# Patient Record
Sex: Female | Born: 1975 | Race: White | Hispanic: No | Marital: Single | Smoking: Former smoker
Health system: Southern US, Community
[De-identification: ages and names within clinical notes are randomized; demographics above are authoritative.]

## PROBLEM LIST (undated history)

## (undated) DIAGNOSIS — I10 Essential (primary) hypertension: Secondary | ICD-10-CM

## (undated) DIAGNOSIS — K469 Unspecified abdominal hernia without obstruction or gangrene: Secondary | ICD-10-CM

## (undated) DIAGNOSIS — Z8719 Personal history of other diseases of the digestive system: Secondary | ICD-10-CM

## (undated) DIAGNOSIS — D649 Anemia, unspecified: Secondary | ICD-10-CM

## (undated) DIAGNOSIS — T8859XA Other complications of anesthesia, initial encounter: Secondary | ICD-10-CM

## (undated) DIAGNOSIS — K819 Cholecystitis, unspecified: Secondary | ICD-10-CM

## (undated) HISTORY — DX: Anemia, unspecified: D64.9

## (undated) HISTORY — DX: Essential (primary) hypertension: I10

---

## 1998-11-28 ENCOUNTER — Emergency Department (HOSPITAL_COMMUNITY): Admission: EM | Admit: 1998-11-28 | Discharge: 1998-11-28 | Payer: Self-pay | Admitting: Emergency Medicine

## 2000-01-03 HISTORY — PX: BREAST SURGERY: SHX581

## 2009-05-26 ENCOUNTER — Emergency Department: Admit: 2009-05-26 | Payer: Self-pay | Source: Emergency Department

## 2011-06-05 ENCOUNTER — Ambulatory Visit (HOSPITAL_COMMUNITY)
Admission: EM | Admit: 2011-06-05 | Discharge: 2011-06-07 | Disposition: A | Discharge status: Home / Self Care | Payer: Medicaid Other | Source: Ambulatory Visit | Attending: General Surgery | Admitting: General Surgery

## 2011-06-05 ENCOUNTER — Encounter (HOSPITAL_COMMUNITY): Payer: Self-pay | Admitting: *Deleted

## 2011-06-05 ENCOUNTER — Emergency Department (HOSPITAL_COMMUNITY): Payer: Medicaid Other

## 2011-06-05 DIAGNOSIS — K819 Cholecystitis, unspecified: Secondary | ICD-10-CM

## 2011-06-05 DIAGNOSIS — K8010 Calculus of gallbladder with chronic cholecystitis without obstruction: Secondary | ICD-10-CM

## 2011-06-05 DIAGNOSIS — K8020 Calculus of gallbladder without cholecystitis without obstruction: Secondary | ICD-10-CM

## 2011-06-05 HISTORY — DX: Personal history of other diseases of the digestive system: Z87.19

## 2011-06-05 HISTORY — DX: Unspecified abdominal hernia without obstruction or gangrene: K46.9

## 2011-06-05 HISTORY — DX: Cholecystitis, unspecified: K81.9

## 2011-06-05 LAB — CBC
HCT: 36.2 % (ref 36.0–46.0)
Hemoglobin: 12 g/dL (ref 12.0–15.0)
MCH: 28.8 pg (ref 26.0–34.0)
MCHC: 33.1 g/dL (ref 30.0–36.0)
MCV: 87 fL (ref 78.0–100.0)
Platelets: 382 10*3/uL (ref 150–400)
RBC: 4.16 MIL/uL (ref 3.87–5.11)
RDW: 13.4 % (ref 11.5–15.5)
WBC: 8.9 10*3/uL (ref 4.0–10.5)

## 2011-06-05 LAB — DIFFERENTIAL
Basophils Absolute: 0 10*3/uL (ref 0.0–0.1)
Basophils Relative: 0 % (ref 0–1)
Eosinophils Absolute: 0 10*3/uL (ref 0.0–0.7)
Eosinophils Relative: 0 % (ref 0–5)
Lymphocytes Relative: 19 % (ref 12–46)
Lymphs Abs: 1.6 10*3/uL (ref 0.7–4.0)
Monocytes Absolute: 0.2 10*3/uL (ref 0.1–1.0)
Monocytes Relative: 3 % (ref 3–12)
Neutro Abs: 7 10*3/uL (ref 1.7–7.7)
Neutrophils Relative %: 79 % — ABNORMAL HIGH (ref 43–77)

## 2011-06-05 LAB — POCT PREGNANCY, URINE: Preg Test, Ur: NEGATIVE

## 2011-06-05 LAB — COMPREHENSIVE METABOLIC PANEL
ALT: 13 U/L (ref 0–35)
AST: 20 U/L (ref 0–37)
Albumin: 4.4 g/dL (ref 3.5–5.2)
Alkaline Phosphatase: 103 U/L (ref 39–117)
BUN: 8 mg/dL (ref 6–23)
CO2: 25 mEq/L (ref 19–32)
Calcium: 9.6 mg/dL (ref 8.4–10.5)
Chloride: 99 mEq/L (ref 96–112)
Creatinine, Ser: 0.83 mg/dL (ref 0.50–1.10)
GFR calc Af Amer: >90 mL/min (ref >90)
GFR calc non Af Amer: >90 mL/min (ref >90)
Glucose, Bld: 129 mg/dL — ABNORMAL HIGH (ref 70–99)
Potassium: 3.2 mEq/L — ABNORMAL LOW (ref 3.5–5.1)
Sodium: 136 mEq/L (ref 135–145)
Total Bilirubin: 0.5 mg/dL (ref 0.3–1.2)
Total Protein: 8.5 g/dL — ABNORMAL HIGH (ref 6.0–8.3)

## 2011-06-05 LAB — URINALYSIS, ROUTINE W REFLEX MICROSCOPIC
Glucose, UA: NEGATIVE mg/dL
Hgb urine dipstick: NEGATIVE
Specific Gravity, Urine: 1.025 (ref 1.005–1.030)
Urobilinogen, UA: 0.2 mg/dL (ref 0.0–1.0)
pH: 7.5 (ref 5.0–8.0)

## 2011-06-05 LAB — LIPASE, BLOOD: Lipase: 29 U/L (ref 11–59)

## 2011-06-05 MED ORDER — ONDANSETRON HCL 4 MG/2ML IJ SOLN
INTRAMUSCULAR | Status: AC
  Filled 2011-06-05: qty 2

## 2011-06-05 MED ORDER — HYDROMORPHONE HCL PF 1 MG/ML IJ SOLN
INTRAMUSCULAR | Status: AC
  Filled 2011-06-05: qty 1

## 2011-06-05 MED ORDER — PIPERACILLIN-TAZOBACTAM 3.375 G IVPB
3.3750 g | Freq: Three times a day (TID) | INTRAVENOUS | Status: DC
  Administered 2011-06-05 – 2011-06-06 (×3): 3.375 g via INTRAVENOUS
  Filled 2011-06-05 (×6): qty 50

## 2011-06-05 MED ORDER — MORPHINE SULFATE 2 MG/ML IJ SOLN
1.0000 mg | INTRAMUSCULAR | Status: DC | PRN
  Administered 2011-06-05 (×3): 2 mg via INTRAVENOUS
  Filled 2011-06-05 (×3): qty 1

## 2011-06-05 MED ORDER — PANTOPRAZOLE SODIUM 40 MG IV SOLR
40.0000 mg | Freq: Every day | INTRAVENOUS | Status: DC
  Administered 2011-06-05: 40 mg via INTRAVENOUS
  Filled 2011-06-05 (×2): qty 40

## 2011-06-05 MED ORDER — ONDANSETRON HCL 4 MG/2ML IJ SOLN
INTRAMUSCULAR | Status: AC
  Administered 2011-06-05: 4 mg via INTRAVENOUS
  Filled 2011-06-05: qty 2

## 2011-06-05 MED ORDER — HYDROMORPHONE HCL PF 1 MG/ML IJ SOLN
1.0000 mg | Freq: Once | INTRAMUSCULAR | Status: AC
  Administered 2011-06-05: 1 mg via INTRAVENOUS
  Filled 2011-06-05: qty 1

## 2011-06-05 MED ORDER — HYDROMORPHONE HCL PF 1 MG/ML IJ SOLN
1.0000 mg | Freq: Once | INTRAMUSCULAR | Status: AC
  Administered 2011-06-05: 1 mg via INTRAVENOUS

## 2011-06-05 MED ORDER — ONDANSETRON HCL 4 MG/2ML IJ SOLN
4.0000 mg | Freq: Once | INTRAMUSCULAR | Status: AC
  Administered 2011-06-05: 4 mg via INTRAVENOUS

## 2011-06-05 MED ORDER — POTASSIUM CHLORIDE IN NACL 20-0.9 MEQ/L-% IV SOLN
INTRAVENOUS | Status: DC
  Administered 2011-06-05 – 2011-06-06 (×2): via INTRAVENOUS
  Administered 2011-06-06: 1000 mL via INTRAVENOUS
  Filled 2011-06-05 (×8): qty 1000

## 2011-06-05 MED ORDER — CHLORHEXIDINE GLUCONATE 4 % EX LIQD
1.0000 "application " | Freq: Once | CUTANEOUS | Status: AC
  Administered 2011-06-06: 1 via TOPICAL
  Filled 2011-06-05: qty 15

## 2011-06-05 MED ORDER — HYDROMORPHONE HCL PF 1 MG/ML IJ SOLN
1.0000 mg | INTRAMUSCULAR | Status: DC | PRN
  Administered 2011-06-05 – 2011-06-06 (×4): 1 mg via INTRAVENOUS
  Filled 2011-06-05 (×4): qty 1

## 2011-06-05 MED ORDER — ONDANSETRON HCL 4 MG/2ML IJ SOLN
4.0000 mg | Freq: Four times a day (QID) | INTRAMUSCULAR | Status: DC | PRN
  Administered 2011-06-05 – 2011-06-06 (×3): 4 mg via INTRAVENOUS
  Filled 2011-06-05 (×2): qty 2

## 2011-06-05 NOTE — ED Provider Notes (Signed)
Medical screening examination/treatment/procedure(s) were conducted as a shared visit with non-physician practitioner(s) and myself.  I personally evaluated the patient during the encounter  2 months of intermittent right upper quadrant epigastric pain usually lasting only a few hours now lasting about 12 hours today the patient is much improved in the ED with only minimal tenderness to the epigastrium right upper quadrant now, for nausea is now controlled, her labs are unremarkable, her ultrasound shows stones in the gallbladder without thickening or pericholecystic fluid. [facility] is paged for likely close followup.  Seen by [facility] who will admit.  Hurman Horn, MD 06/05/11 [phone]

## 2011-06-05 NOTE — ED Provider Notes (Signed)
History     CSN: [id number]  Arrival date & time 06/05/11  0919   First MD Initiated Contact with Patient 06/05/11 1029      Chief Complaint  Patient presents with  . Abdominal Pain    (Consider location/radiation/quality/duration/timing/severity/associated sxs/prior treatment) HPI Patient presents emergency department right upper quadrant abdominal pain that began around 2 AM.  Patient, says she has had this kind of pain over the last several months and did not seem to last as much as it has in the last few weeks.  Patient, says she has had nausea and vomiting associated with it.  Patient denies chest pain, shortness of breath, vaginal discharge, vaginal bleeding, dysuria, fever, or weakness.  Patient states that nothing seems to make the pain better and she thinks eating makes the pain worse.  Patient has no previous abdominal surgeries Past Medical History  Diagnosis Date  . Hernia     Past Surgical History  Procedure Date  . Breast surgery     No family history on file.  History  Substance Use Topics  . Smoking status: Never Smoker   . Smokeless tobacco: Not on file  . Alcohol Use: Yes     occ    OB History    Grav Para Term Preterm Abortions TAB SAB Ect Mult Living                  Review of Systems All other systems negative except as documented in the HPI. All pertinent positives and negatives as reviewed in the HPI.  Allergies  Review of patient's allergies indicates no known allergies.  Home Medications  No current outpatient prescriptions on file.  BP 132/78  Pulse 68  Temp(Src) 97.9 F (36.6 C) (Oral)  Resp 18  SpO2 98%  LMP 06/02/2011  Physical Exam  Constitutional: She appears well-developed and well-nourished. She appears distressed.  HENT:  Head: Normocephalic and atraumatic.  Mouth/Throat: Oropharynx is clear and moist.  Eyes: Pupils are equal, round, and reactive to light.  Cardiovascular: Normal rate, regular rhythm and normal heart  sounds.  Exam reveals no gallop and no friction rub.   No murmur heard. Pulmonary/Chest: Effort normal and breath sounds normal. No respiratory distress. She has no wheezes.  Abdominal: Soft. Normal appearance and bowel sounds are normal. She exhibits no mass. There is tenderness in the right upper quadrant and epigastric area. There is guarding. There is no rigidity, no rebound, no tenderness at McBurney's point and negative Murphy's sign.    Skin: Skin is warm and dry. No rash noted.    ED Course  Procedures (including critical care time)  Labs Reviewed  URINALYSIS, ROUTINE W REFLEX MICROSCOPIC - Abnormal; Notable for the following:    APPearance CLOUDY (*)    All other components within normal limits  DIFFERENTIAL - Abnormal; Notable for the following:    Neutrophils Relative 79 (*)    All other components within normal limits  COMPREHENSIVE METABOLIC PANEL - Abnormal; Notable for the following:    Potassium 3.2 (*)    Glucose, Bld 129 (*)    Total Protein 8.5 (*)    All other components within normal limits  POCT PREGNANCY, URINE  CBC  LIPASE, BLOOD   US Abdomen Complete  06/05/2011  *RADIOLOGY REPORT*  Clinical Data:  Upper abdominal pain.  COMPLETE ABDOMINAL ULTRASOUND  Comparison:  None.  Findings:  Gallbladder:  Two shadowing echogenic stones are seen in the gallbladder, measuring up to 1.7 cm.  The  largest stone appears lodged in the gallbladder neck.  Gallbladder wall measures 2 mm, within normal limits.  No sonographic Murphy's sign.  No pericholecystic fluid.  Common bile duct:  6 mm, slightly prominent for age.  Liver:  No focal lesion identified.  Within normal limits in parenchymal echogenicity.  IVC:  Appears normal.  Pancreas:  No focal abnormality seen.  Spleen:  6.4 cm, negative.  Right Kidney:  12.6 cm.  Normal parenchymal echogenicity.  No hydronephrosis.  Left Kidney:  11.9 cm.  Normal parenchymal echogenicity.  No hydronephrosis.  Abdominal aorta:  No aneurysm  identified.  IMPRESSION:  1.  Cholelithiasis without acute cholecystitis. The largest stone appears lodged in the gallbladder neck. 2.  Common bile duct is mildly prominent for age.  Original Report Authenticated By: Reyes Ivan, M.D.    Surgery has come down and evaluate the patient and will admit her to the hospital for removal of her gallbladder.  Patient is currently comfortable and understands the plan.  Patient has been reassessed x3 while here in the emergency department.  Patient's been kept fully aware of her findings and course here in the ER.     MDM  MDM Reviewed: vitals and nursing note Interpretation: labs and ultrasound Consults: general surgery            Carlyle Dolly, PA-C 06/05/11 1514

## 2011-06-05 NOTE — H&P (Addendum)
I have personally interviewed and examined this patient, and I have reviewed her lab and x-ray findings. I agree with the history and physical as outlined.  She is having recurrent attacks of biliary colic, and had a severe attack last night. She may have low-grade acute cholecystitis, or perhaps just severe biliary colic. She would like to proceed with cholecystectomy this admission.  She will be kept on antibiotics and kept n.p.o. With the hope that we can proceed with cholecystectomy tomorrow.  I discussed the indications, details, techniques, and numerous risks of cholecystectomy with the patient and her mother. All questions are answered. She understands these issues. Her questions are answered. She agrees with this plan.   Angelia Mould. Derrell Lolling, M.D., [facility] General and Minimally invasive Surgery Breast and Colorectal Surgery Office:   [phone] Pager:   [phone]

## 2011-06-05 NOTE — ED Notes (Signed)
Pt vomited small amount of greenish-yellow emesis.

## 2011-06-05 NOTE — H&P (Signed)
Sheena Wells 1975-06-08  [id number].   Primary Care MD: Dr. Waynard Edwards Lacy Duverney) Requesting MD: Dr. Fonnie Jarvis  Chief Complaint: Abdominal Pain, vomiting HPI: Patient is a 36 year old female who presents to [facility] with abdominal pain and vomiting.  She reports her pain began at 2am and has not improved.  She describes her pain as severe and grabbing in nature.  It is located in the epigastric region with radiation to the back and right flank.  This is accompanied by vomiting.  She has had some chills in the last 24 hours but denies fever.  She has had one episode of diarrhea on June 1st.  She also denies urinary symptoms, chest pain, shortness of breath, edema, constipation.  Review of Systems: All systems reviewed and are Negative except as indicated in HPI  No family history on file.  Past Medical History  Diagnosis Date  . Hernia     Past Surgical History  Procedure Date  . Breast surgery (bilateral lumpectomy(benign findings)     Social History:  reports that she has never smoked. She does not have any smokeless tobacco history on file. She reports that she drinks alcohol. She reports that she does not use illicit drugs.  Allergies: No Known Allergies   (Not in a hospital admission)  Blood pressure 132/78, pulse 68, temperature 97.9 F (36.6 C), temperature source Oral, resp. rate 18, last menstrual period 06/02/2011, SpO2 98.00%. Physical Exam:General:  WDWN in NAD.  Pleasant and cooperative.  HEENT:  NCAT, EOMI, no icterus.  NECK:  Supple, no obvious mass or thyroid enlargement.  CV:  RRR, no murmur, no JVD.  CHEST:  No scars.  RESPIRATORY:  Breath sounds equal and clear. Respirations nonlabored.  ABDOMEN:  Soft, mildly tender in the epigastric region, nondistended, no masses, no organomegaly, active bowel sounds, no scars, no hernias.  GU:  No bulges.  MUSCULOSKELETAL:  FROM, good muscle tone, no edema, no venous stasis changes  LYMPHATIC: No palpable cervical,  supraclavicular, axillary adenopathy.  SKIN:  No jaundice or suspicious rashes.  NEUROLOGIC:  Alert and oriented, answers questions appropriately, moves all four extremities equally   Results for orders placed during the hospital encounter of 06/05/11 (from the past 48 hour(s))  URINALYSIS, ROUTINE W REFLEX MICROSCOPIC     Status: Abnormal   Collection Time   06/05/11 10:08 AM      Component Value Range Comment   Color, Urine YELLOW  YELLOW     APPearance CLOUDY (*) CLEAR     Specific Gravity, Urine 1.025  1.005 - 1.030     pH 7.5  5.0 - 8.0     Glucose, UA NEGATIVE  NEGATIVE (mg/dL)    Hgb urine dipstick NEGATIVE  NEGATIVE     Bilirubin Urine NEGATIVE  NEGATIVE     Ketones, ur NEGATIVE  NEGATIVE (mg/dL)    Protein, ur NEGATIVE  NEGATIVE (mg/dL)    Urobilinogen, UA 0.2  0.0 - 1.0 (mg/dL)    Nitrite NEGATIVE  NEGATIVE     Leukocytes, UA NEGATIVE  NEGATIVE  MICROSCOPIC NOT DONE ON URINES WITH NEGATIVE PROTEIN, BLOOD, LEUKOCYTES, NITRITE, OR GLUCOSE <1000 mg/dL.  POCT PREGNANCY, URINE     Status: Normal   Collection Time   06/05/11 10:13 AM      Component Value Range Comment   Preg Test, Ur NEGATIVE  NEGATIVE    CBC     Status: Normal   Collection Time   06/05/11 11:04 AM      Component  Value Range Comment   WBC 8.9  4.0 - 10.5 (K/uL)    RBC 4.16  3.87 - 5.11 (MIL/uL)    Hemoglobin 12.0  12.0 - 15.0 (g/dL)    HCT 16.1  09.6 - 04.5 (%)    MCV 87.0  78.0 - 100.0 (fL)    MCH 28.8  26.0 - 34.0 (pg)    MCHC 33.1  30.0 - 36.0 (g/dL)    RDW 40.9  81.1 - 91.4 (%)    Platelets 382  150 - 400 (K/uL)   DIFFERENTIAL     Status: Abnormal   Collection Time   06/05/11 11:04 AM      Component Value Range Comment   Neutrophils Relative 79 (*) 43 - 77 (%)    Neutro Abs 7.0  1.7 - 7.7 (K/uL)    Lymphocytes Relative 19  12 - 46 (%)    Lymphs Abs 1.6  0.7 - 4.0 (K/uL)    Monocytes Relative 3  3 - 12 (%)    Monocytes Absolute 0.2  0.1 - 1.0 (K/uL)    Eosinophils Relative 0  0 - 5 (%)     Eosinophils Absolute 0.0  0.0 - 0.7 (K/uL)    Basophils Relative 0  0 - 1 (%)    Basophils Absolute 0.0  0.0 - 0.1 (K/uL)   COMPREHENSIVE METABOLIC PANEL     Status: Abnormal   Collection Time   06/05/11 11:04 AM      Component Value Range Comment   Sodium 136  135 - 145 (mEq/L)    Potassium 3.2 (*) 3.5 - 5.1 (mEq/L)    Chloride 99  96 - 112 (mEq/L)    CO2 25  19 - 32 (mEq/L)    Glucose, Bld 129 (*) 70 - 99 (mg/dL)    BUN 8  6 - 23 (mg/dL)    Creatinine, Ser 7.82  0.50 - 1.10 (mg/dL)    Calcium 9.6  8.4 - 10.5 (mg/dL)    Total Protein 8.5 (*) 6.0 - 8.3 (g/dL)    Albumin 4.4  3.5 - 5.2 (g/dL)    AST 20  0 - 37 (U/L)    ALT 13  0 - 35 (U/L)    Alkaline Phosphatase 103  39 - 117 (U/L)    Total Bilirubin 0.5  0.3 - 1.2 (mg/dL)    GFR calc non Af Amer >90  >90 (mL/min)    GFR calc Af Amer >90  >90 (mL/min)   LIPASE, BLOOD     Status: Normal   Collection Time   06/05/11 11:04 AM      Component Value Range Comment   Lipase 29  11 - 59 (U/L)    US Abdomen Complete  06/05/2011  *RADIOLOGY REPORT*  Clinical Data:  Upper abdominal pain.  COMPLETE ABDOMINAL ULTRASOUND  Comparison:  None.  Findings:  Gallbladder:  Two shadowing echogenic stones are seen in the gallbladder, measuring up to 1.7 cm.  The largest stone appears lodged in the gallbladder neck.  Gallbladder wall measures 2 mm, within normal limits.  No sonographic Murphy's sign.  No pericholecystic fluid.  Common bile duct:  6 mm, slightly prominent for age.  Liver:  No focal lesion identified.  Within normal limits in parenchymal echogenicity.  IVC:  Appears normal.  Pancreas:  No focal abnormality seen.  Spleen:  6.4 cm, negative.  Right Kidney:  12.6 cm.  Normal parenchymal echogenicity.  No hydronephrosis.  Left Kidney:  11.9 cm.  Normal parenchymal  echogenicity.  No hydronephrosis.  Abdominal aorta:  No aneurysm identified.  IMPRESSION:  1.  Cholelithiasis without acute cholecystitis. The largest stone appears lodged in the gallbladder  neck. 2.  Common bile duct is mildly prominent for age.  Original Report Authenticated By: Reyes Ivan, M.D.       Assessment/Plan 1.  Gallstone in gallbladder neck with unresolving pain and vomiting:  Will admit patient to med/surg floor.  Will make NPO and start IVFs.  Start IV abx, prn zofran and prn pain meds.  Dr. Derrell Lolling will see patient later today and likely plan for lap cholecystectomy tomorrow.    Rayanna Matusik 06/05/2011, 2:26 PM

## 2011-06-05 NOTE — ED Notes (Signed)
Pt is here with left side abdominal pain that radiates around to back and has umbilical hernia.  Pt reports vomiting.  Pt sts started at 0200.  Pt denies vaginal discharge or bleeding or urinary s/s.  LMP-  Ended 06/02/2011

## 2011-06-05 NOTE — Progress Notes (Signed)
UR complete 

## 2011-06-06 ENCOUNTER — Encounter (HOSPITAL_COMMUNITY): Payer: Self-pay | Admitting: Certified Registered"

## 2011-06-06 ENCOUNTER — Encounter (HOSPITAL_COMMUNITY): Admission: EM | Disposition: A | Discharge status: Home / Self Care | Payer: Self-pay | Source: Ambulatory Visit

## 2011-06-06 ENCOUNTER — Inpatient Hospital Stay (HOSPITAL_COMMUNITY): Payer: Medicaid Other | Admitting: Certified Registered"

## 2011-06-06 ENCOUNTER — Inpatient Hospital Stay (HOSPITAL_COMMUNITY): Payer: Medicaid Other

## 2011-06-06 HISTORY — PX: CHOLECYSTECTOMY: SHX55

## 2011-06-06 LAB — COMPREHENSIVE METABOLIC PANEL
ALT: 10 U/L (ref 0–35)
AST: 13 U/L (ref 0–37)
Albumin: 3.5 g/dL (ref 3.5–5.2)
Alkaline Phosphatase: 83 U/L (ref 39–117)
BUN: 6 mg/dL (ref 6–23)
CO2: 27 mEq/L (ref 19–32)
Calcium: 8.5 mg/dL (ref 8.4–10.5)
Chloride: 103 mEq/L (ref 96–112)
Creatinine, Ser: 0.93 mg/dL (ref 0.50–1.10)
GFR calc Af Amer: >90 mL/min (ref >90)
GFR calc non Af Amer: 79 mL/min — ABNORMAL LOW (ref >90)
Glucose, Bld: 122 mg/dL — ABNORMAL HIGH (ref 70–99)
Potassium: 3.3 mEq/L — ABNORMAL LOW (ref 3.5–5.1)
Sodium: 139 mEq/L (ref 135–145)
Total Bilirubin: 0.8 mg/dL (ref 0.3–1.2)
Total Protein: 7 g/dL (ref 6.0–8.3)

## 2011-06-06 LAB — CBC
HCT: 32.2 % — ABNORMAL LOW (ref 36.0–46.0)
Hemoglobin: 10.5 g/dL — ABNORMAL LOW (ref 12.0–15.0)
MCH: 28.5 pg (ref 26.0–34.0)
MCHC: 32.6 g/dL (ref 30.0–36.0)
MCV: 87.3 fL (ref 78.0–100.0)
Platelets: 347 10*3/uL (ref 150–400)
RBC: 3.69 MIL/uL — ABNORMAL LOW (ref 3.87–5.11)
RDW: 13.5 % (ref 11.5–15.5)
WBC: 9.9 10*3/uL (ref 4.0–10.5)

## 2011-06-06 SURGERY — LAPAROSCOPIC CHOLECYSTECTOMY WITH INTRAOPERATIVE CHOLANGIOGRAM
Anesthesia: General | Site: Abdomen | Wound class: Contaminated

## 2011-06-06 MED ORDER — BUPIVACAINE-EPINEPHRINE 0.25% -1:200000 IJ SOLN
INTRAMUSCULAR | Status: DC | PRN
  Administered 2011-06-06: 16 mL

## 2011-06-06 MED ORDER — HYDROMORPHONE HCL PF 1 MG/ML IJ SOLN
0.2500 mg | INTRAMUSCULAR | Status: DC | PRN
  Administered 2011-06-06 (×2): 0.5 mg via INTRAVENOUS

## 2011-06-06 MED ORDER — LACTATED RINGERS IV SOLN
INTRAVENOUS | Status: DC | PRN
  Administered 2011-06-06 (×2): via INTRAVENOUS

## 2011-06-06 MED ORDER — GLYCOPYRROLATE 0.2 MG/ML IJ SOLN
INTRAMUSCULAR | Status: DC | PRN
  Administered 2011-06-06: 0.6 mg via INTRAVENOUS

## 2011-06-06 MED ORDER — SODIUM CHLORIDE 0.9 % IV SOLN
INTRAVENOUS | Status: DC | PRN
  Administered 2011-06-06: 13:00:00

## 2011-06-06 MED ORDER — MORPHINE SULFATE 2 MG/ML IJ SOLN: 2.0000 mg | INTRAMUSCULAR | Status: DC | PRN

## 2011-06-06 MED ORDER — ONDANSETRON HCL 4 MG/2ML IJ SOLN: 4.0000 mg | Freq: Four times a day (QID) | INTRAMUSCULAR | Status: DC | PRN

## 2011-06-06 MED ORDER — CEFAZOLIN SODIUM-DEXTROSE 2-3 GM-% IV SOLR
2.0000 g | Freq: Three times a day (TID) | INTRAVENOUS | Status: DC
  Administered 2011-06-06 – 2011-06-07 (×2): 2 g via INTRAVENOUS
  Filled 2011-06-06 (×3): qty 50

## 2011-06-06 MED ORDER — FENTANYL CITRATE 0.05 MG/ML IJ SOLN
INTRAMUSCULAR | Status: DC | PRN
  Administered 2011-06-06: 100 ug via INTRAVENOUS
  Administered 2011-06-06 (×3): 50 ug via INTRAVENOUS

## 2011-06-06 MED ORDER — LIDOCAINE HCL (CARDIAC) 20 MG/ML IV SOLN
INTRAVENOUS | Status: DC | PRN
  Administered 2011-06-06: 100 mg via INTRAVENOUS

## 2011-06-06 MED ORDER — NEOSTIGMINE METHYLSULFATE 1 MG/ML IJ SOLN
INTRAMUSCULAR | Status: DC | PRN
  Administered 2011-06-06: 4 mg via INTRAVENOUS

## 2011-06-06 MED ORDER — PROPOFOL 10 MG/ML IV EMUL
INTRAVENOUS | Status: DC | PRN
  Administered 2011-06-06: 120 mg via INTRAVENOUS

## 2011-06-06 MED ORDER — CEFAZOLIN SODIUM-DEXTROSE 2-3 GM-% IV SOLR
2.0000 g | Freq: Three times a day (TID) | INTRAVENOUS | Status: DC
  Filled 2011-06-06 (×2): qty 50

## 2011-06-06 MED ORDER — ONDANSETRON HCL 4 MG/2ML IJ SOLN
INTRAMUSCULAR | Status: DC | PRN
  Administered 2011-06-06: 4 mg via INTRAVENOUS

## 2011-06-06 MED ORDER — 0.9 % SODIUM CHLORIDE (POUR BTL) OPTIME
TOPICAL | Status: DC | PRN
  Administered 2011-06-06: 1000 mL

## 2011-06-06 MED ORDER — POTASSIUM CHLORIDE IN NACL 20-0.9 MEQ/L-% IV SOLN
INTRAVENOUS | Status: DC
  Administered 2011-06-06: 21:00:00 via INTRAVENOUS
  Filled 2011-06-06 (×4): qty 1000

## 2011-06-06 MED ORDER — ONDANSETRON HCL 4 MG/2ML IJ SOLN: 4.0000 mg | Freq: Once | INTRAMUSCULAR | Status: DC | PRN

## 2011-06-06 MED ORDER — HYDROCODONE-ACETAMINOPHEN 5-325 MG PO TABS
1.0000 | ORAL_TABLET | ORAL | Status: DC | PRN
  Administered 2011-06-06 – 2011-06-07 (×3): 2 via ORAL
  Filled 2011-06-06 (×3): qty 2

## 2011-06-06 MED ORDER — MIDAZOLAM HCL 5 MG/5ML IJ SOLN
INTRAMUSCULAR | Status: DC | PRN
  Administered 2011-06-06 (×2): 2 mg via INTRAVENOUS

## 2011-06-06 MED ORDER — HEMOSTATIC AGENTS (NO CHARGE) OPTIME
TOPICAL | Status: DC | PRN
  Administered 2011-06-06: 1

## 2011-06-06 MED ORDER — ROCURONIUM BROMIDE 100 MG/10ML IV SOLN
INTRAVENOUS | Status: DC | PRN
  Administered 2011-06-06: 50 mg via INTRAVENOUS

## 2011-06-06 MED ORDER — CEFAZOLIN SODIUM-DEXTROSE 2-3 GM-% IV SOLR
2.0000 g | INTRAVENOUS | Status: AC
  Administered 2011-06-06: 2 g via INTRAVENOUS
  Filled 2011-06-06: qty 50

## 2011-06-06 MED ORDER — SODIUM CHLORIDE 0.9 % IR SOLN
Status: DC | PRN
  Administered 2011-06-06 (×2): 1000 mL

## 2011-06-06 MED ORDER — LACTATED RINGERS IV SOLN
INTRAVENOUS | Status: DC
  Administered 2011-06-06: 12:00:00 via INTRAVENOUS

## 2011-06-06 MED ORDER — ONDANSETRON HCL 4 MG PO TABS: 4.0000 mg | ORAL_TABLET | Freq: Four times a day (QID) | ORAL | Status: DC | PRN

## 2011-06-06 MED ORDER — CEFAZOLIN SODIUM 1-5 GM-% IV SOLN
INTRAVENOUS | Status: AC
  Filled 2011-06-06: qty 50

## 2011-06-06 SURGICAL SUPPLY — 52 items
ADH SKN CLS APL DERMABOND .7 (GAUZE/BANDAGES/DRESSINGS) ×1
ADH SKN CLS LQ APL DERMABOND (GAUZE/BANDAGES/DRESSINGS) ×1
APPLIER CLIP ROT 10 11.4 M/L (STAPLE) ×2
APR CLP MED LRG 11.4X10 (STAPLE) ×1
BAG SPEC RTRVL LRG 6X4 10 (ENDOMECHANICALS) ×1
BLADE SURG ROTATE 9660 (MISCELLANEOUS) IMPLANT
CANISTER SUCTION 2500CC (MISCELLANEOUS) ×2 IMPLANT
CHLORAPREP W/TINT 26ML (MISCELLANEOUS) ×2 IMPLANT
CLIP APPLIE ROT 10 11.4 M/L (STAPLE) ×1 IMPLANT
CLOTH BEACON ORANGE TIMEOUT ST (SAFETY) ×2 IMPLANT
COVER MAYO STAND STRL (DRAPES) ×2 IMPLANT
COVER SURGICAL LIGHT HANDLE (MISCELLANEOUS) ×2 IMPLANT
DECANTER SPIKE VIAL GLASS SM (MISCELLANEOUS) ×2 IMPLANT
DERMABOND ADHESIVE PROPEN (GAUZE/BANDAGES/DRESSINGS) ×1
DERMABOND ADVANCED (GAUZE/BANDAGES/DRESSINGS) ×1
DERMABOND ADVANCED .7 DNX12 (GAUZE/BANDAGES/DRESSINGS) ×1 IMPLANT
DERMABOND ADVANCED .7 DNX6 (GAUZE/BANDAGES/DRESSINGS) IMPLANT
DRAPE C-ARM 42X72 X-RAY (DRAPES) ×2 IMPLANT
DRAPE UTILITY 15X26 W/TAPE STR (DRAPE) ×2 IMPLANT
ELECT REM PT RETURN 9FT ADLT (ELECTROSURGICAL) ×2
ELECTRODE REM PT RTRN 9FT ADLT (ELECTROSURGICAL) ×1 IMPLANT
GLOVE BIO SURGEON STRL SZ 6 (GLOVE) ×1 IMPLANT
GLOVE BIO SURGEON STRL SZ7.5 (GLOVE) ×1 IMPLANT
GLOVE BIO SURGEON STRL SZ8 (GLOVE) ×1 IMPLANT
GLOVE BIOGEL PI IND STRL 7.0 (GLOVE) IMPLANT
GLOVE BIOGEL PI IND STRL 7.5 (GLOVE) IMPLANT
GLOVE BIOGEL PI IND STRL 8 (GLOVE) IMPLANT
GLOVE BIOGEL PI INDICATOR 7.0 (GLOVE) ×2
GLOVE BIOGEL PI INDICATOR 7.5 (GLOVE) ×1
GLOVE BIOGEL PI INDICATOR 8 (GLOVE) ×1
GLOVE EUDERMIC 7 POWDERFREE (GLOVE) ×2 IMPLANT
GLOVE SURG SS PI 7.0 STRL IVOR (GLOVE) ×2 IMPLANT
GOWN PREVENTION PLUS XLARGE (GOWN DISPOSABLE) ×3 IMPLANT
GOWN STRL NON-REIN LRG LVL3 (GOWN DISPOSABLE) ×6 IMPLANT
HEMOSTAT SURGICEL 2X4 FIBR (HEMOSTASIS) ×1 IMPLANT
KIT BASIN OR (CUSTOM PROCEDURE TRAY) ×2 IMPLANT
KIT ROOM TURNOVER OR (KITS) ×2 IMPLANT
NS IRRIG 1000ML POUR BTL (IV SOLUTION) ×2 IMPLANT
PAD ARMBOARD 7.5X6 YLW CONV (MISCELLANEOUS) ×2 IMPLANT
POUCH SPECIMEN RETRIEVAL 10MM (ENDOMECHANICALS) ×2 IMPLANT
SCISSORS LAP 5X35 DISP (ENDOMECHANICALS) ×1 IMPLANT
SET CHOLANGIOGRAPH 5 50 .035 (SET/KITS/TRAYS/PACK) ×2 IMPLANT
SET IRRIG TUBING LAPAROSCOPIC (IRRIGATION / IRRIGATOR) ×3 IMPLANT
SLEEVE ENDOPATH XCEL 5M (ENDOMECHANICALS) ×3 IMPLANT
SPECIMEN JAR SMALL (MISCELLANEOUS) ×2 IMPLANT
SUT MNCRL AB 4-0 PS2 18 (SUTURE) ×2 IMPLANT
TOWEL OR 17X24 6PK STRL BLUE (TOWEL DISPOSABLE) ×2 IMPLANT
TOWEL OR 17X26 10 PK STRL BLUE (TOWEL DISPOSABLE) ×2 IMPLANT
TRAY LAPAROSCOPIC (CUSTOM PROCEDURE TRAY) ×2 IMPLANT
TROCAR XCEL BLUNT TIP 100MML (ENDOMECHANICALS) ×2 IMPLANT
TROCAR XCEL NON-BLD 11X100MML (ENDOMECHANICALS) ×1 IMPLANT
TROCAR XCEL NON-BLD 5MMX100MML (ENDOMECHANICALS) ×2 IMPLANT

## 2011-06-06 NOTE — Op Note (Signed)
Patient Name:           Sheena Wells   Date of Surgery:        06/06/2011  Pre op Diagnosis:      Acute cholecystitis with cholelithiasis  Post op Diagnosis:    Acute cholecystitis with cholelithiasis  Procedure:                 Laparoscopic cholecystectomy with cholangiogram  Surgeon:                     Angelia Mould. Derrell Lolling, M.D., FACS  Assistant:                      Violeta Gelinas, M.D., FACS  Operative Indications:   This is a 36 year old African American female who presented to the emergency room yesterday afternoon with upper abdominal pain right flank pain and vomiting. She has had 3 or 4 prior episodes. This was the worst ever. Pain located in the epigastrium radiating to the back. She denies fever but had some chills.examination in the emergency room reveals some tenderness in the epigastrium and right upper quadrant. CBC, liver tests, and lipase were normal. Ultrasound shows gallstones and a large stone lodged in the neck of the gallbladder, but no wall thickening. Because of unremitting pain she was admitted placed on antibiotics and analgesics. This morning she still having some pain although she is improved. She is brought to the operating room for cholecystectomy  Operative Findings:       The gallbladder was markedly edematous and discolored but there was no exudate and no gangrene. This was clearly acute cholecystitis. The cholangiogram was normal, showing normal intrahepatic and extrahepatic bile ducts, no filling defect, no dilatation, and no obstruction was good flow of contrast into the duodenum. The liver, stomach, duodenum, and small intestine, and large intestine were grossly normal to inspection.  Procedure in Detail:          Following the induction of general endotracheal anesthesia the patient's abdomen was prepped and draped in a sterile fashion. Surgical time out was performed. 0.5% Marcaine with epinephrine was used as a local infiltration anesthetic. An 11 mm Hassan  trocar was inserted in the infraumbilical position with an open technique uneventfully. Pneumoperitoneum was created. The camera was inserted. A 5 mm trocar was placed in the subxiphoid region and Two mm trocars placed in the right upper quadrant. We lifted the gallbladder up and use a suction trocar to evacuate the bowel. With the gallbladder up  We dissected out the cystic duct and cystic artery. We identified an anterior branch of the cystic artery, isolated it and  secured with metal clips and divided. A posterior branch of the cystic artery was  likewise controlled with metal clips and divided . The cholangiogram catheter was inserted into the cystic duct and a cholangiogram was obtained using the C-arm. The IOC was normal as described above. The cholangiocatheter was removed, the cystic duct secured with 3 more metal clips and divided. The gallbladder was dissected from its bed with electrocautery placed in a specimen bag and removed. The operative field was irrigated. There were a couple bleeders in the gallbladder bed we cauterized this. We had a tiny capsular tear of the right lobe of the liver which was controlled with cautery and methylcellulose hemostatic sponge. The irrigation fluid became completely clear. We observed a small capsular laceration for several minutes and there was no further bleeding. The trocars were  removed. The pneumoperitoneum was released. The fascia at the umbilicus was closed with 0 Vicryl sutures. Skin incisions were closed with subcuticular sutures of 4-0 Monocryl and Dermabond. The patient was taken recovery in stable condition. There were no complications. Counts correct. EBL 25 cc.     Angelia Mould. Derrell Lolling, M.D., FACS General and Minimally Invasive Surgery Breast and Colorectal Surgery  06/06/2011 2:21 PM

## 2011-06-06 NOTE — Anesthesia Procedure Notes (Signed)
Procedure Name: Intubation Date/Time: 06/06/2011 12:46 PM Performed by: De Nurse Pre-anesthesia Checklist: Patient identified, Emergency Drugs available, Suction available, Patient being monitored and Timeout performed Patient Re-evaluated:Patient Re-evaluated prior to inductionOxygen Delivery Method: Circle system utilized Preoxygenation: Pre-oxygenation with 100% oxygen Intubation Type: IV induction Ventilation: Mask ventilation without difficulty Laryngoscope Size: Mac and 3 Grade View: Grade I Tube type: Oral Tube size: 7.0 mm Number of attempts: 1 Airway Equipment and Method: Stylet Placement Confirmation: ETT inserted through vocal cords under direct vision,  positive ETCO2 and breath sounds checked- equal and bilateral Secured at: 23 cm Tube secured with: Tape Dental Injury: Teeth and Oropharynx as per pre-operative assessment

## 2011-06-06 NOTE — Preoperative (Signed)
Beta Blockers   Reason not to administer Beta Blockers:Not Applicable 

## 2011-06-06 NOTE — Anesthesia Postprocedure Evaluation (Signed)
  Anesthesia Post-op Note  Patient: Sheena Wells  Procedure(s) Performed: Procedure(s) (LRB): LAPAROSCOPIC CHOLECYSTECTOMY WITH INTRAOPERATIVE CHOLANGIOGRAM (N/A)  Patient Location: PACU  Anesthesia Type: General  Level of Consciousness: awake  Airway and Oxygen Therapy: Patient Spontanous Breathing and Patient connected to face mask oxygen  Post-op Pain: mild  Post-op Assessment: Post-op Vital signs reviewed, Patient's Cardiovascular Status Stable, Respiratory Function Stable, Patent Airway and No signs of Nausea or vomiting  Post-op Vital Signs: Reviewed and stable  Complications: No apparent anesthesia complications

## 2011-06-06 NOTE — Progress Notes (Signed)
  Subjective: Stable and alert. Still has intermittent pain and intermittent nausea, the symptoms are well controlled with medication. She would like to proceed with cholecystectomy today, if possible.  Objective: Vital signs in last 24 hours: Temp:  [97.2 F (36.2 C)-98.1 F (36.7 C)] 97.2 F (36.2 C) (06/04 0537) Pulse Rate:  [58-86] 86  (06/04 0537) Resp:  [18-22] 18  (06/04 0537) BP: (121-144)/(61-89) 121/61 mmHg (06/04 0537) SpO2:  [98 %-100 %] 99 % (06/04 0537) Weight:  [200 lb (90.719 kg)] 200 lb (90.719 kg) (06/04 0307) Last BM Date: 06/03/11  Intake/Output from previous day: 06/03 0701 - 06/04 0700 In: 2400 [I.V.:1200; IV Piggyback:1200] Out: -  Intake/Output this shift: Total I/O In: 2400 [I.V.:1200; IV Piggyback:1200] Out: -   General appearance: alert. Mental status normal. Skin warm and dry. In no distress. GI: abdomen is soft and nondistended. Almost nontender. No mass.  Lab Results:   Basename 06/05/11 1104  WBC 8.9  HGB 12.0  HCT 36.2  PLT 382   BMET  Basename 06/05/11 1104  NA 136  K 3.2*  CL 99  CO2 25  GLUCOSE 129*  BUN 8  CREATININE 0.83  CALCIUM 9.6   PT/INR No results found for this basename: LABPROT:2,INR:2 in the last 72 hours ABG No results found for this basename: PHART:2,PCO2:2,PO2:2,HCO3:2 in the last 72 hours  Studies/Results: US Abdomen Complete  06/05/2011  *RADIOLOGY REPORT*  Clinical Data:  Upper abdominal pain.  COMPLETE ABDOMINAL ULTRASOUND  Comparison:  None.  Findings:  Gallbladder:  Two shadowing echogenic stones are seen in the gallbladder, measuring up to 1.7 cm.  The largest stone appears lodged in the gallbladder neck.  Gallbladder wall measures 2 mm, within normal limits.  No sonographic Murphy's sign.  No pericholecystic fluid.  Common bile duct:  6 mm, slightly prominent for age.  Liver:  No focal lesion identified.  Within normal limits in parenchymal echogenicity.  IVC:  Appears normal.  Pancreas:  No focal  abnormality seen.  Spleen:  6.4 cm, negative.  Right Kidney:  12.6 cm.  Normal parenchymal echogenicity.  No hydronephrosis.  Left Kidney:  11.9 cm.  Normal parenchymal echogenicity.  No hydronephrosis.  Abdominal aorta:  No aneurysm identified.  IMPRESSION:  1.  Cholelithiasis without acute cholecystitis. The largest stone appears lodged in the gallbladder neck. 2.  Common bile duct is mildly prominent for age.  Original Report Authenticated By: Reyes Ivan, M.D.    Anti-infectives: Anti-infectives     Start     Dose/Rate Route Frequency Ordered Stop   06/05/11 1600  piperacillin-tazobactam (ZOSYN) IVPB 3.375 g       3.375 g 12.5 mL/hr over 240 Minutes Intravenous Every 8 hours 06/05/11 1542            Assessment/Plan:  Severe biliary colic versus low-grade acute cholecystitis with cholelithiasis.  Plan to proceed with laparoscopic cholecystectomy with cholangiogram at our earliest convenience. Hopefully today and less OR schedule does not allow.  Operative procedures and risks were discussed again. All of her questions are answered.    LOS: 1 day    Jeilani Grupe M. Derrell Lolling, M.D., [facility] General and Minimally invasive Surgery Breast and Colorectal Surgery Office:   [phone] Pager:   [phone]  06/06/2011

## 2011-06-06 NOTE — Anesthesia Preprocedure Evaluation (Addendum)
Anesthesia Evaluation  Patient identified by MRN, date of birth, ID band Patient awake    Reviewed: Allergy & Precautions, H&P , NPO status , Patient's Chart, lab work & pertinent test results  Airway Mallampati: I TM Distance: >3 FB Neck ROM: Full    Dental  (+) Teeth Intact and Dental Advisory Given   Pulmonary  breath sounds clear to auscultation        Cardiovascular Rhythm:Regular Rate:Normal     Neuro/Psych    GI/Hepatic   Endo/Other    Renal/GU      Musculoskeletal   Abdominal   Peds  Hematology   Anesthesia Other Findings   Reproductive/Obstetrics                          Anesthesia Physical Anesthesia Plan  ASA: II  Anesthesia Plan: General   Post-op Pain Management:    Induction: Intravenous  Airway Management Planned: Oral ETT  Additional Equipment:   Intra-op Plan:   Post-operative Plan: Extubation in OR  Informed Consent: I have reviewed the patients History and Physical, chart, labs and discussed the procedure including the risks, benefits and alternatives for the proposed anesthesia with the patient or authorized representative who has indicated his/her understanding and acceptance.     Plan Discussed with: CRNA and Surgeon  Anesthesia Plan Comments:         Anesthesia Quick Evaluation  

## 2011-06-06 NOTE — Transfer of Care (Signed)
Immediate Anesthesia Transfer of Care Note  Patient: Sheena Wells  Procedure(s) Performed: Procedure(s) (LRB): LAPAROSCOPIC CHOLECYSTECTOMY WITH INTRAOPERATIVE CHOLANGIOGRAM (N/A)  Patient Location: PACU  Anesthesia Type: General  Level of Consciousness: awake, alert  and oriented  Airway & Oxygen Therapy: Patient Spontanous Breathing and Patient connected to face mask oxygen  Post-op Assessment: Report given to PACU RN  Post vital signs: Reviewed and stable  Complications: No apparent anesthesia complications

## 2011-06-06 NOTE — Progress Notes (Signed)
PT continues to have pain despite Morphine 2 mg IV given,MD notified,new order received.

## 2011-06-07 ENCOUNTER — Encounter (HOSPITAL_COMMUNITY): Payer: Self-pay | Admitting: General Surgery

## 2011-06-07 MED ORDER — HYDROCODONE-ACETAMINOPHEN 5-325 MG PO TABS: 1.0000 | ORAL_TABLET | ORAL | Status: AC | PRN

## 2011-06-07 MED ORDER — POLYETHYLENE GLYCOL 3350 17 G PO PACK
17.0000 g | PACK | Freq: Every day | ORAL | Status: DC
  Administered 2011-06-07: 17 g via ORAL
  Filled 2011-06-07: qty 1

## 2011-06-07 NOTE — Progress Notes (Signed)
1 Day Post-Op  Subjective: Stable and alert. Ate a bowl of pasta last night with gusto.  No nausea. Voiding okay. No stools. Pain under reasonable control.  Objective: Vital signs in last 24 hours: Temp:  [97.2 F (36.2 C)-99.4 F (37.4 C)] 98.6 F (37 C) (06/05 0601) Pulse Rate:  [65-111] 109  (06/05 0601) Resp:  [16-20] 18  (06/05 0601) BP: (109-145)/(54-106) 109/60 mmHg (06/05 0601) SpO2:  [91 %-100 %] 97 % (06/05 0601) Last BM Date: 06/03/11  Intake/Output from previous day: 06/04 0701 - 06/05 0700 In: 2111.7 [I.V.:2061.7; IV Piggyback:50] Out: 550 [Urine:550] Intake/Output this shift: Total I/O In: 161.7 [I.V.:161.7] Out: 350 [Urine:350]  General appearance: alert GI: soft, non-tender; bowel sounds normal; no masses,  no organomegaly  Lab Results:  No results found for this or any previous visit (from the past 24 hour(s)).   Studies/Results: @RISRSLT24 @     .  ceFAZolin (ANCEF) IV  2 g Intravenous To PACU  .  ceFAZolin (ANCEF) IV  2 g Intravenous Q8H  . DISCONTD:  ceFAZolin (ANCEF) IV  2 g Intravenous Q8H  . DISCONTD: pantoprazole (PROTONIX) IV  40 mg Intravenous QHS  . DISCONTD: piperacillin-tazobactam (ZOSYN)  IV  3.375 g Intravenous Q8H     Assessment/Plan: s/p Procedure(s): LAPAROSCOPIC CHOLECYSTECTOMY WITH INTRAOPERATIVE CHOLANGIOGRAM  POD #1. Stable Discharge home today. Diet and activities discussed. Given prescription for hydrocodone  She is instructed to followup with me in office in 3-4 weeks.    Patient Active Hospital Problem List: No active hospital problems.   LOS: 2 days    Kaeson Kleinert M 06/07/2011  . .prob

## 2011-06-07 NOTE — Discharge Summary (Signed)
  Patient ID: Sheena Wells [id number] 35 y.o. 1975/12/08  06/05/2011  Discharge date and time: No discharge date for patient encounter.  Admitting Physician: Ernestene Mention  Discharge Physician: Ernestene Mention  Admission Diagnoses: Gallstones [574.20] Cholecystolithiasis cholecystitis  Discharge Diagnoses: acute cholecystitis with cholelithiasis  Operations: Procedure(s): LAPAROSCOPIC CHOLECYSTECTOMY WITH INTRAOPERATIVE CHOLANGIOGRAM  Admission Condition: fair  Discharged Condition: good  Indication for Admission: this patient was admitted on 06/05/2011 with a 12 hour history of abdominal pain and nausea. She is found to have right upper quadrant and right or right flank pain and tenderness.ultrasound showed gallstones and a large stone lodged in the neck of the gallbladder but no gallbladder wall thickening. Lab test was normal. The car she was tender and is worse she was having recurrent episodes she was admitted for cholecystectomy and was started on IV fluid hydration, analgesics, and antibiotics.  Hospital Course: the morning following admission she underwent laparoscopic cholecystectomy with cholangiogram, with findings of acute cholecystitis and edema of the gallbladder. There was a stone impacted in the neck of the gallbladder. The cholangiogram was normal. She did well postop and advanced in her diet and activities uneventfully. She was discharged the following morning. She was given a prescription for Vicodin for pain. Diet activities were discussed. Followup with Dr. Derrell Lolling was arranged in 4 weeks.  Consults: None  Significant Diagnostic Studies: radiology: gallbladder ultrasound. Intraoperative cholangiogram.  Treatments: surgery: laparoscopic cholecystectomy with cholangiogram  Disposition: Home  Patient Instructions:   Nakhia, Levitan  Home Medication Instructions WUJ:[id number]   Printed on:06/07/11 0631  Medication Information                      HYDROcodone-acetaminophen (NORCO) 5-325 MG per tablet Take 1-2 tablets by mouth every 4 (four) hours as needed for pain.             Activity: aactivities were discussed.  2 week restriction on lifting or sports. See detailed discharge instructions. Diet: low fat, low cholesterol diet Wound Care: none needed  Follow-up:  With Dr. Derrell Lolling in 4 weeks.  Signed: Angelia Mould. Derrell Lolling, M.D., FACS General and minimally invasive surgery Breast and Colorectal Surgery  06/07/2011, 6:31 AM

## 2011-06-07 NOTE — Discharge Instructions (Signed)
[facility] ______[facility] °LAPAROSCOPIC SURGERY: POST OP INSTRUCTIONS °Always review your discharge instruction sheet given to you by the facility where your surgery was performed. °IF YOU HAVE DISABILITY OR FAMILY LEAVE FORMS, YOU MUST BRING THEM TO THE OFFICE FOR PROCESSING.   °DO NOT GIVE THEM TO YOUR DOCTOR. ° °1. A prescription for pain medication may be given to you upon discharge.  Take your pain medication as prescribed, if needed.  If narcotic pain medicine is not needed, then you may take acetaminophen (Tylenol) or ibuprofen (Advil) as needed. °2. Take your usually prescribed medications unless otherwise directed. °3. If you need a refill on your pain medication, please contact your pharmacy.  They will contact our office to request authorization. Prescriptions will not be filled after 5pm or on week-ends. °4. You should follow a light diet the first few days after arrival home, such as soup and crackers, etc.  Be sure to include lots of fluids daily. °5. Most patients will experience some swelling and bruising in the area of the incisions.  Ice packs will help.  Swelling and bruising can take several days to resolve.  °6. It is common to experience some constipation if taking pain medication after surgery.  Increasing fluid intake and taking a stool softener (such as Colace) will usually help or prevent this problem from occurring.  A mild laxative (Milk of Magnesia or Miralax) should be taken according to package instructions if there are no bowel movements after 48 hours. °7. Unless discharge instructions indicate otherwise, you may remove your bandages 24-48 hours after surgery, and you may shower at that time.  You may have steri-strips (small skin tapes) in place directly over the incision.  These strips should be left on the skin for 7-10 days.  If your surgeon used skin glue on the incision, you may shower in 24 hours.  The glue will flake off over the next 2-3 weeks.  Any sutures or  staples will be removed at the office during your follow-up visit. °8. ACTIVITIES:  You may resume regular (light) daily activities beginning the next day--such as daily self-care, walking, climbing stairs--gradually increasing activities as tolerated.  You may have sexual intercourse when it is comfortable.  Refrain from any heavy lifting or straining until approved by your doctor. °a. You may drive when you are no longer taking prescription pain medication, you can comfortably wear a seatbelt, and you can safely maneuver your car and apply brakes. °b. RETURN TO WORK:  __________________________________________________________ °9. You should see your doctor in the office for a follow-up appointment approximately 2-3 weeks after your surgery.  Make sure that you call for this appointment within a day or two after you arrive home to insure a convenient appointment time. °10. OTHER INSTRUCTIONS: __________________________________________________________________________________________________________________________ __________________________________________________________________________________________________________________________ °WHEN TO CALL YOUR DOCTOR: °1. Fever over 101.0 °2. Inability to urinate °3. Continued bleeding from incision. °4. Increased pain, redness, or drainage from the incision. °5. Increasing abdominal pain ° °The clinic staff is available to answer your questions during regular business hours.  Please don’t hesitate to call and ask to speak to one of the nurses for clinical concerns.  If you have a medical emergency, go to the nearest emergency room or call 911.  A surgeon from [facility] is always on call at the hospital. °[address], [address], [place], [place]  [address] ? [address] [address], [place], [place]   [address] °[phone] ? [phone] ? FAX [phone] °Web site:   [email or web address] °

## 2011-07-12 ENCOUNTER — Encounter (INDEPENDENT_AMBULATORY_CARE_PROVIDER_SITE_OTHER): Payer: Self-pay | Admitting: General Surgery

## 2011-07-12 ENCOUNTER — Ambulatory Visit (INDEPENDENT_AMBULATORY_CARE_PROVIDER_SITE_OTHER): Payer: Medicaid Other | Admitting: General Surgery

## 2011-07-12 VITALS — BP 126/74 | HR 72 | Temp 97.1°F | Resp 14 | Ht 73.0 in | Wt 206.4 lb

## 2011-07-12 DIAGNOSIS — K8042 Calculus of bile duct with acute cholecystitis without obstruction: Secondary | ICD-10-CM

## 2011-07-12 DIAGNOSIS — K8000 Calculus of gallbladder with acute cholecystitis without obstruction: Secondary | ICD-10-CM

## 2011-07-12 NOTE — Progress Notes (Signed)
Subjective:     Patient ID: Sheena Wells, female   DOB: 1975/01/10, 36 y.o.   MRN: [id number]  HPI Laparoscopic cholecystectomy with cholangiogram performed on June 06, 2011 for acute cholecystitis with cholelithiasis. Cholangiogram was normal. She has recovered without any problems. Normal appetite. Normal bowel function. No pain or wound problems.  Review of Systems     Objective:   Physical Exam Patient looks well. In no distress. Good spirits  Abdomen: Soft and nontender. No distention. Wounds well healed.    Assessment:     Acute and cholecystitis with cholelithiasis, uneventful recovery following laparoscopic cholecystectomy with cholangiogram    Plan:     Low-fat diet.  Resume normal activities. No restriction.  Return to see me p.r.n.   Angelia Mould. Derrell Lolling, M.D., [facility] General and Minimally invasive Surgery Breast and Colorectal Surgery Office:   [phone] Pager:   [phone]

## 2011-07-12 NOTE — Patient Instructions (Signed)
You have recovered from your gallbladder surgery without any obvious complications.  You are advised to follow a low-fat diet.  There are no restrictions on your activity. You may exercise and play sports.  Return to see Dr. Derrell Lolling if any further problems arise.

## 2014-08-26 ENCOUNTER — Ambulatory Visit (INDEPENDENT_AMBULATORY_CARE_PROVIDER_SITE_OTHER): Payer: 59 | Admitting: Obstetrics and Gynecology

## 2014-08-26 ENCOUNTER — Other Ambulatory Visit: Payer: Self-pay | Admitting: Obstetrics and Gynecology

## 2014-08-26 ENCOUNTER — Encounter: Payer: Self-pay | Admitting: Obstetrics and Gynecology

## 2014-08-26 VITALS — BP 128/80 | HR 84 | Resp 16 | Ht 72.0 in | Wt 195.0 lb

## 2014-08-26 DIAGNOSIS — N63 Unspecified lump in breast: Secondary | ICD-10-CM | POA: Diagnosis not present

## 2014-08-26 DIAGNOSIS — Z113 Encounter for screening for infections with a predominantly sexual mode of transmission: Secondary | ICD-10-CM

## 2014-08-26 DIAGNOSIS — N920 Excessive and frequent menstruation with regular cycle: Secondary | ICD-10-CM

## 2014-08-26 DIAGNOSIS — N6322 Unspecified lump in the left breast, upper inner quadrant: Secondary | ICD-10-CM

## 2014-08-26 DIAGNOSIS — Z01419 Encounter for gynecological examination (general) (routine) without abnormal findings: Secondary | ICD-10-CM | POA: Diagnosis not present

## 2014-08-26 DIAGNOSIS — Z124 Encounter for screening for malignant neoplasm of cervix: Secondary | ICD-10-CM

## 2014-08-26 DIAGNOSIS — N946 Dysmenorrhea, unspecified: Secondary | ICD-10-CM

## 2014-08-26 MED ORDER — NAPROXEN SODIUM 550 MG PO TABS: ORAL_TABLET | ORAL | Status: DC

## 2014-08-26 NOTE — Patient Instructions (Signed)
Oral Contraception Information Oral contraceptive pills (OCPs) are medicines taken to prevent pregnancy. OCPs work by preventing the ovaries from releasing eggs. The hormones in OCPs also cause the cervical mucus to thicken, preventing the sperm from entering the uterus. The hormones also cause the uterine lining to become thin, not allowing a fertilized egg to attach to the inside of the uterus. OCPs are highly effective when taken exactly as prescribed. However, OCPs do not prevent sexually transmitted diseases (STDs). Safe sex practices, such as using condoms along with the pill, can help prevent STDs.  Before taking the pill, you may have a physical exam and Pap test. Your health care provider may order blood tests. The health care provider will make sure you are a good candidate for oral contraception. Discuss with your health care provider the possible side effects of the OCP you may be prescribed. When starting an OCP, it can take 2 to 3 months for the body to adjust to the changes in hormone levels in your body.  TYPES OF ORAL CONTRACEPTION  The combination pill--This pill contains estrogen and progestin (synthetic progesterone) hormones. The combination pill comes in 21-day, 28-day, or 91-day packs. Some types of combination pills are meant to be taken continuously (365-day pills). With 21-day packs, you do not take pills for 7 days after the last pill. With 28-day packs, the pill is taken every day. The last 7 pills are without hormones. Certain types of pills have more than 21 hormone-containing pills. With 91-day packs, the first 84 pills contain both hormones, and the last 7 pills contain no hormones or contain estrogen only.  The minipill--This pill contains the progesterone hormone only. The pill is taken every day continuously. It is very important to take the pill at the same time each day. The minipill comes in packs of 28 pills. All 28 pills contain the hormone.  ADVANTAGES OF ORAL  CONTRACEPTIVE PILLS  Decreases premenstrual symptoms.   Treats menstrual period cramps.   Regulates the menstrual cycle.   Decreases a heavy menstrual flow.   May treatacne, depending on the type of pill.   Treats abnormal uterine bleeding.   Treats polycystic ovarian syndrome.   Treats endometriosis.   Can be used as emergency contraception.  THINGS THAT CAN MAKE ORAL CONTRACEPTIVE PILLS LESS EFFECTIVE OCPs can be less effective if:   You forget to take the pill at the same time every day.   You have a stomach or intestinal disease that lessens the absorption of the pill.   You take OCPs with other medicines that make OCPs less effective, such as antibiotics, certain HIV medicines, and some seizure medicines.   You take expired OCPs.   You forget to restart the pill on day 7, when using the packs of 21 pills.  RISKS ASSOCIATED WITH ORAL CONTRACEPTIVE PILLS  Oral contraceptive pills can sometimes cause side effects, such as:  Headache.  Nausea.  Breast tenderness.  Irregular bleeding or spotting. Combination pills are also associated with a small increased risk of:  Blood clots.  Heart attack.  Stroke. Document Released: 03/11/2002 Document Revised: 10/09/2012 Document Reviewed: 06/09/2012 [facility] Patient Information 2015 [place], [place]. This information is not intended to replace advice given to you by your health care provider. Make sure you discuss any questions you have with your health care provider.   EXERCISE AND DIET:  We recommended that you start or continue a regular exercise program for good health. Regular exercise means any activity that makes your  heart beat faster and makes you sweat.  We recommend exercising at least 30 minutes per day at least 3 days a week, preferably 4 or 5.  We also recommend a diet low in fat and sugar.  Inactivity, poor dietary choices and obesity can cause diabetes, heart attack, stroke, and kidney damage,  among others.    ALCOHOL AND SMOKING:  Women should limit their alcohol intake to no more than 7 drinks/beers/glasses of wine (combined, not each!) per week. Moderation of alcohol intake to this level decreases your risk of breast cancer and liver damage. And of course, no recreational drugs are part of a healthy lifestyle.  And absolutely no smoking or even second hand smoke. Most people know smoking can cause heart and lung diseases, but did you know it also contributes to weakening of your bones? Aging of your skin?  Yellowing of your teeth and nails?  CALCIUM AND VITAMIN D:  Adequate intake of calcium and Vitamin D are recommended.  The recommendations for exact amounts of these supplements seem to change often, but generally speaking 600 mg of calcium (either carbonate or citrate) and 800 units of Vitamin D per day seems prudent. Certain women may benefit from higher intake of Vitamin D.  If you are among these women, your doctor will have told you during your visit.    PAP SMEARS:  Pap smears, to check for cervical cancer or precancers,  have traditionally been done yearly, although recent scientific advances have shown that most women can have pap smears less often.  However, every woman still should have a physical exam from her gynecologist every year. It will include a breast check, inspection of the vulva and vagina to check for abnormal growths or skin changes, a visual exam of the cervix, and then an exam to evaluate the size and shape of the uterus and ovaries.  And after 38 years of age, a rectal exam is indicated to check for rectal cancers. We will also provide age appropriate advice regarding health maintenance, like when you should have certain vaccines, screening for sexually transmitted diseases, bone density testing, colonoscopy, mammograms, etc.   MAMMOGRAMS:  All women over 31 years old should have a yearly mammogram. Many facilities now offer a "3D" mammogram, which may cost around  $50 extra out of pocket. If possible,  we recommend you accept the option to have the 3D mammogram performed.  It both reduces the number of women who will be called back for extra views which then turn out to be normal, and it is better than the routine mammogram at detecting truly abnormal areas.    COLONOSCOPY:  Colonoscopy to screen for colon cancer is recommended for all women at age 4.  We know, you hate the idea of the prep.  We agree, BUT, having colon cancer and not knowing it is worse!!  Colon cancer so often starts as a polyp that can be seen and removed at colonscopy, which can quite literally save your life!  And if your first colonoscopy is normal and you have no family history of colon cancer, most women don't have to have it again for 10 years.  Once every ten years, you can do something that may end up saving your life, right?  We will be happy to help you get it scheduled when you are ready.  Be sure to check your insurance coverage so you understand how much it will cost.  It may be covered as a preventative service  at no cost, but you should check your particular policy.

## 2014-08-26 NOTE — Progress Notes (Signed)
Patient ID: Sheena Wells, female   DOB: 1975/06/11, 39 y.o.   MRN: [id number] 39 y.o. G1P1001 SingleAfrican AmericanF here for annual exam.  Menses q month x 7 days. Saturates a pad in up to 1 hour for several days of her cycles. Cramps are bad, partially helped with Alieve. Not currently sexually active. Uses condoms. She tried OCP's in the distant past and had palpitations. She just saw her primary this morning who drew blood work to check for anemia.   Patient's last menstrual period was 08/04/2014.          Sexually active: Yes.    The current method of family planning is condoms most of the time.    Exercising: No.  The patient does not participate in regular exercise at present. Smoker:  no  Health Maintenance: Pap:  2012 WNL per patient History of abnormal Pap:  Yes -repeated PAP and it was normal MMG:  Never Colonoscopy:  N/A BMD:   N/A TDaP:  08-26-14 Screening Labs: PCP does labs    reports that she has never smoked. She has never used smokeless tobacco. She reports that she drinks alcohol. She reports that she does not use illicit drugs.  Past Medical History  Diagnosis Date  . Hernia   . H/O hiatal hernia   . Cholecystitis 06/05/2011  . Anemia     Past Surgical History  Procedure Laterality Date  . Cholecystectomy  06/06/2011    Procedure: LAPAROSCOPIC CHOLECYSTECTOMY WITH INTRAOPERATIVE CHOLANGIOGRAM;  Surgeon: Adin Hector, MD;  Location: [facility];  Service: General;  Laterality: N/A;  . Breast surgery  2002    mass removed from both breasts    No current outpatient prescriptions on file.   No current facility-administered medications for this visit.    Family History  Problem Relation Age of Onset  . Hypertension Mother   . Diabetes Mother   . Stroke Maternal Grandmother     ROS:  Pertinent items are noted in HPI.  Some bowel issues, primary thinks she may have IBS.  Otherwise, a comprehensive ROS was negative.  SH:  Exam:   BP 128/80 mmHg  Pulse 84   Resp 16  Ht 6' (1.829 m)  Wt 195 lb (88.451 kg)  BMI 26.44 kg/m2  LMP 08/04/2014  Weight change: @WEIGHTCHANGE @ Height:   Height: 6' (182.9 cm)  Ht Readings from Last 3 Encounters:  08/26/14 6' (1.829 m)  07/12/11 6\' 1"  (1.854 m)  06/06/11 6\' 1"  (1.854 m)    General appearance: alert, cooperative and appears stated age Head: Normocephalic, without obvious abnormality, atraumatic Neck: no adenopathy, supple, symmetrical, trachea midline and thyroid top normal sized Lungs: clear to auscultation bilaterally Breasts: evidence of bilateral breast biopsies. In the left breast around 11 o'clock, 2.5 cm under the scar she has a 2 cm, mobile, non-tender lump. Normal right breast exam. Heart: regular rate and rhythm Abdomen: soft, non-tender; bowel sounds normal; no masses,  no organomegaly Extremities: extremities normal, atraumatic, no cyanosis or edema Skin: Skin color, texture, turgor normal. No rashes or lesions Lymph nodes: Cervical, supraclavicular, and axillary nodes normal. No abnormal inguinal nodes palpated Neurologic: Grossly normal   Pelvic: External genitalia:  no lesions              Urethra:  normal appearing urethra with no masses, tenderness or lesions              Bartholins and Skenes: normal  Vagina: normal appearing vagina with normal color and discharge, no lesions              Cervix: no lesions              Pap taken: Yes.   Bimanual Exam:  Uterus:  Uterus is top normal sized, irregularly shaped. Suspect subserosal fibroid coming off of the left side of her uterus.Anteverted, mobile, not tender.               Adnexa: normal adnexa and no mass, fullness, tenderness               Rectovaginal: Confirms               Anus:  normal sphincter tone, no lesions  Chaperone was present for exam.  A:  Annual exam  Left breast lump  Menorrhagia  Suspected myoma  H/O anemia  Screening for STD  Screening for cervical cancer  Dysmenorrhea  P:   Pap with  hpv and genprobe  STD testing  Diagnostic imaging attention left breast  TSH  Get a copy of her labs from her primary  Will set up a gyn ultrasound, possible sonohysterogram (depending on her ultrasound results and if she is anemic)  Anaprox for cramps  Discussed possibly trying low dose OCP's or the mirena IUD for cycle control (and birth control), information given on both

## 2014-08-27 LAB — TSH: TSH: 4.689 u[IU]/mL — AB (ref 0.350–4.500)

## 2014-08-27 LAB — STD PANEL
HIV: NONREACTIVE
Hepatitis B Surface Ag: NEGATIVE

## 2014-08-27 LAB — HEPATITIS C ANTIBODY: HCV Ab: NEGATIVE

## 2014-08-28 ENCOUNTER — Telehealth: Payer: Self-pay

## 2014-08-28 ENCOUNTER — Other Ambulatory Visit: Payer: Self-pay | Admitting: Obstetrics and Gynecology

## 2014-08-28 DIAGNOSIS — R7989 Other specified abnormal findings of blood chemistry: Secondary | ICD-10-CM

## 2014-08-28 LAB — T3, FREE: T3, Free: 2.3 pg/mL (ref 2.3–4.2)

## 2014-08-28 LAB — T4, FREE: FREE T4: 0.77 ng/dL — AB (ref 0.80–1.80)

## 2014-08-28 NOTE — Telephone Encounter (Signed)
-----   Message from Salvadore Dom, MD sent at 08/28/2014  9:02 AM EDT ----- Please inform the patient that her TSH is slightly elevated. I've added a FT3 andFT4. Please send a copy to her primary MD, she will need to be followed there. The STD blood work was negative, cervical cultures and pap are still pending.

## 2014-08-28 NOTE — Telephone Encounter (Signed)
Left message to call Sheena Wells at [phone]. 

## 2014-08-31 ENCOUNTER — Encounter: Payer: Self-pay | Admitting: Obstetrics and Gynecology

## 2014-08-31 LAB — IPS PAP TEST WITH HPV

## 2014-08-31 LAB — IPS N GONORRHOEA AND CHLAMYDIA BY PCR

## 2014-09-03 ENCOUNTER — Telehealth: Payer: Self-pay | Admitting: *Deleted

## 2014-09-03 NOTE — Telephone Encounter (Signed)
Patient returning call.

## 2014-09-03 NOTE — Telephone Encounter (Signed)
LMTC in regards to lab results -eh 

## 2014-09-03 NOTE — Telephone Encounter (Signed)
Patient returned call and I gave her test results -eh

## 2014-09-04 NOTE — Telephone Encounter (Signed)
Patient was notified of her TSH, T3 and T4 results by Starlyn Skeans, CMA. Please see result note below. Advised patient of message as seen below from [place] regarding iron. Patient states that her PCP already started her on a prescription Iron that she has started taking twice a day. Advised to continue taking as instructed by PCP. Patient is agreeable. Advised of recommendation for [facility]. Patient would like to return call next week to schedule.  Notes Recorded by Nicholes Rough, CMA on 09/03/2014 at 10:51 AM 09-03-14 patient aware of her blood work- copy sent to PCP-eh Notes Recorded by Nicholes Rough, CMA on 09/03/2014 at 9:21 AM 09-03-14 [facility] -eh Notes Recorded by Salvadore Dom, MD on 09/02/2014 at 5:51 PM Please inform the patient that her free T4 is slightly low and her TSH is slightly elevated. She needs to f/u with her primary. Please send a copy of the labs and her office visit to her primary.  Routing to provider for final review. Patient agreeable to disposition. Will close encounter.

## 2014-09-04 NOTE — Telephone Encounter (Signed)
Please let the patient know that I got her labs from her primary MD and she is very anemic, hgb of 8.4/hct 27.6. She should be on iron TID. Please make sure she gets on the schedule for a sonohysterogram in the next few weeks. Thanks.

## 2014-09-06 NOTE — Telephone Encounter (Signed)
Please keep this patient on hold until she has her sonohysterogram. Thanks

## 2014-09-10 ENCOUNTER — Telehealth: Payer: Self-pay | Admitting: Obstetrics and Gynecology

## 2014-09-10 NOTE — Telephone Encounter (Signed)
Called patient to review benefits for procedure. Left voicemail to call back and review. Attempting to schedule [facility] for 09/15/14 with Dr Talbert Nan.

## 2014-09-17 NOTE — Telephone Encounter (Signed)
Left message to call Zebulan Hinshaw at [phone]. 

## 2014-09-18 NOTE — Telephone Encounter (Signed)
Second call to patient to verify benefits and schedule. Left voicemail. Routing to provider for review.

## 2014-09-22 NOTE — Telephone Encounter (Signed)
Order still in workque for shgm. 2 attempted calls with no return since last call. Please advise next step.

## 2014-09-24 ENCOUNTER — Encounter: Payer: Self-pay | Admitting: Emergency Medicine

## 2014-09-24 DIAGNOSIS — N6320 Unspecified lump in the left breast, unspecified quadrant: Secondary | ICD-10-CM

## 2014-09-24 NOTE — Telephone Encounter (Signed)
We have requested the blood work from her primary from last month. Can you please try and get this. I'm most interested in her CBC. I would be less worried about the sonohysterogram if she isn't anemic.

## 2014-09-24 NOTE — Telephone Encounter (Signed)
  Lab results available in care everywhere.  Hgb 08/28/14 was 8.4  Message left to return call to [place] at [phone].   Per Dr. Talbert Nan, needs Sonohysterogram.  Also, needs to schedule diagnostic breast imaging.   Letter sent to home address of record to please call office to schedule diagnostic breast imaging and Sonohysterogram.

## 2014-09-28 NOTE — Telephone Encounter (Signed)
Call to patient. She states that she does not feel that her vaginal bleeding is concerning and does not feel that additional testing is necessary. Advised patient that Sonohysterogram is recommended evaluate for any presence of abnormalities of the uterus, endometrium, ovaries and can evaluate for changes of the uterine lining, polyps or fibroids. Patient states she is aware that she is anemic and states that this has been ongoing.   Patient agreeable to breast imaging and appointment scheduled at her request of date: 10/09/14 at 1530. Call to patient to advise. She requests that I email her the information. Confirmed she will check mychart and she states she will check her messages there. Appointment information sent via mychart.   Will keep in mammogram hold.   Dr. Talbert Nan, please advise if order for Sonohysterogram can be cancelled due to patient refusal or additional letter be mailed to patient?

## 2014-09-29 NOTE — Telephone Encounter (Signed)
Spoke with the patient, explained that bleeding to the point of anemia isn't normal. Discussed the reasoning behind the sonohysterogram. The patient is currently on her cycle. Willing to set up an appointment for next week, will arrange. Alinda Sierras will schedule her.

## 2014-09-30 ENCOUNTER — Telehealth: Payer: Self-pay

## 2014-09-30 DIAGNOSIS — N920 Excessive and frequent menstruation with regular cycle: Secondary | ICD-10-CM

## 2014-09-30 DIAGNOSIS — N946 Dysmenorrhea, unspecified: Secondary | ICD-10-CM

## 2014-09-30 NOTE — Telephone Encounter (Signed)
Patient is scheduled for [facility] on 10/06/2014 at 2 pm with 2:30 pm consult with Dr.Jertson.  Routing to provider for final review. Patient agreeable to disposition. Will close encounter.

## 2014-09-30 NOTE — Telephone Encounter (Signed)
Patient is scheduled for 10/06/14. Will close encounter.

## 2014-09-30 NOTE — Telephone Encounter (Signed)
-----   Message from Elenore Paddy sent at 09/29/2014  2:04 PM EDT ----- Scheduled patient for [facility] this Tuesday 10/06/14  Please enter order.  Thank you!!  becky

## 2014-09-30 NOTE — Telephone Encounter (Signed)
Order placed and linked to appointment for [facility] which is scheduled for 10/06/2014 with Dr.Jertson.  Cc: Theresia Lo  Routing to provider for final review. Patient agreeable to disposition. Will close encounter.

## 2014-10-06 ENCOUNTER — Ambulatory Visit (INDEPENDENT_AMBULATORY_CARE_PROVIDER_SITE_OTHER): Payer: 59

## 2014-10-06 ENCOUNTER — Ambulatory Visit (INDEPENDENT_AMBULATORY_CARE_PROVIDER_SITE_OTHER): Payer: 59 | Admitting: Obstetrics and Gynecology

## 2014-10-06 ENCOUNTER — Other Ambulatory Visit: Payer: Self-pay | Admitting: Obstetrics and Gynecology

## 2014-10-06 ENCOUNTER — Encounter: Payer: Self-pay | Admitting: Obstetrics and Gynecology

## 2014-10-06 VITALS — BP 140/82 | HR 72 | Resp 16 | Wt 189.0 lb

## 2014-10-06 DIAGNOSIS — N946 Dysmenorrhea, unspecified: Secondary | ICD-10-CM | POA: Diagnosis not present

## 2014-10-06 DIAGNOSIS — D259 Leiomyoma of uterus, unspecified: Secondary | ICD-10-CM

## 2014-10-06 DIAGNOSIS — D500 Iron deficiency anemia secondary to blood loss (chronic): Secondary | ICD-10-CM | POA: Diagnosis not present

## 2014-10-06 DIAGNOSIS — N840 Polyp of corpus uteri: Secondary | ICD-10-CM

## 2014-10-06 DIAGNOSIS — N920 Excessive and frequent menstruation with regular cycle: Secondary | ICD-10-CM | POA: Diagnosis not present

## 2014-10-06 NOTE — Progress Notes (Signed)
U/S addendum: on 3D visualization the smaller myoma is 2.3 cm and is 1.8 cm from the serosa of the uterus.   GYNECOLOGY  VISIT   HPI: 39 y.o.   Single  African American  female   [id number] with LMP 09/26/14 here for evaluation of menorrhagia leading to anemia, dysmenorrhea. Recent hgb of 8.4 with a ferritin on 6, normal hgb electrophoresis. She is taking one iron a day (can't tolerate 2).    GYNECOLOGIC HISTORY: LMP 09/26/14 Contraception:condoms-most of the time Menopausal hormone therapy: none        OB History    Gravida Para Term Preterm AB TAB SAB Ectopic Multiple Living   1 1 1       1          There are no active problems to display for this patient.   Past Medical History  Diagnosis Date  . Hernia   . H/O hiatal hernia   . Cholecystitis 06/05/2011  . Anemia     Past Surgical History  Procedure Laterality Date  . Cholecystectomy  06/06/2011    Procedure: LAPAROSCOPIC CHOLECYSTECTOMY WITH INTRAOPERATIVE CHOLANGIOGRAM;  Surgeon: Adin Hector, MD;  Location: [place];  Service: General;  Laterality: N/A;  . Breast surgery  2002    mass removed from both breasts    Current Outpatient Prescriptions  Medication Sig Dispense Refill  . iron polysaccharides (NIFEREX) 150 MG capsule Take 150 mg by mouth.    Marland Kitchen PROCTOSOL HC 2.5 % rectal cream USE AS DIRECTED NIGHTLY AS NEEDED.  3  . dicyclomine (BENTYL) 10 MG capsule Take 10 mg by mouth.    . naproxen sodium (ANAPROX DS) 550 MG tablet 1 tab po q 12 hours prn (Patient not taking: Reported on 10/06/2014) 30 tablet 3   No current facility-administered medications for this visit.     ALLERGIES: Review of patient's allergies indicates no known allergies.  Family History  Problem Relation Age of Onset  . Hypertension Mother   . Diabetes Mother   . Stroke Maternal Grandmother     Social History   Social History  . Marital Status: Single    Spouse Name: N/A  . Number of Children: N/A  . Years of Education: N/A    Occupational History  . Not on file.   Social History Main Topics  . Smoking status: Never Smoker   . Smokeless tobacco: Never Used  . Alcohol Use: 0.0 oz/week    0 Standard drinks or equivalent per week     Comment: occasional  . Drug Use: No  . Sexual Activity:    Partners: Male   Other Topics Concern  . Not on file   Social History Narrative    ROS  PHYSICAL EXAMINATION:    BP 140/82 mmHg  Pulse 72  Resp 16  Wt 189 lb (85.73 kg)  LMP 09/26/2014    General appearance: alert, cooperative and appears stated age  Pelvic: External genitalia:  no lesions              Urethra:  normal appearing urethra with no masses, tenderness or lesions              Bartholins and Skenes: normal                 Vagina: normal appearing vagina with normal color and discharge, no lesions              Cervix: without lesions   Sonohysterogram The procedure and  risks of the procedure were reviewed with the patient, consent form was signed. A speculum was placed in the vagina and the cervix was cleansed with betadine. The sonohysterogram catheter was inserted into the uterine cavity without difficulty. Saline was infused under direct observation with the ultrasound, the fluid quickly leaked out, used 2 60 cc syringes full of NS with suboptimal uterine distention. The HSG catheter was passed into the uterine cavity and saline was infused, now with good uterine distention. 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    Chaperone was present for exam.  ASSESSMENT Menorrhagia Anemia Dysmenorrhea Endometrial polyp Fibroid uterus    PLAN Discussed with the patient that her anemia is not normal. Encouraged her to try to increase her iron to 2 x a day (not causing constipation) Recommend hysteroscopy, polypectomy, possible myomectomy, dilation  and curettage. Reviewed risks, reviewed the procedure. AGOG handouts given Discussed possible use of OCP's, she doesn't want to take medication and is interested in future pregnancies. BP was slightly elevated today, but I think it is related to the procedure.    An After Visit Summary was printed and given to the patient.  15 minutes face to face time of which over 50% was spent in counseling.

## 2014-10-08 ENCOUNTER — Telehealth: Payer: Self-pay | Admitting: Obstetrics and Gynecology

## 2014-10-08 NOTE — Telephone Encounter (Signed)
Called patient to review surgery benefits. Left voicemail to return call.

## 2014-10-09 ENCOUNTER — Ambulatory Visit
Admission: RE | Admit: 2014-10-09 | Discharge: 2014-10-09 | Disposition: A | Discharge status: Home / Self Care | Payer: 59 | Source: Ambulatory Visit | Attending: Obstetrics and Gynecology | Admitting: Obstetrics and Gynecology

## 2014-10-09 DIAGNOSIS — N6320 Unspecified lump in the left breast, unspecified quadrant: Secondary | ICD-10-CM

## 2014-10-09 NOTE — Telephone Encounter (Signed)
Reviewed surgery benefit with patient. Patient understood and agreeable. Notified patient benefit is professional only and hospital will contact to review benefit separately. Patient understood and agreeable. Reviewed deposit policy with patient. Patient has concerns and does not wish to schedule at this time. Patient agreeable to return call from nurse to discuss.

## 2014-10-14 NOTE — Telephone Encounter (Signed)
Follow-up call to patient regarding surgery scheduling.  Patient states she has to check with employer regarding available dates before even discussing scheduling. Advised that physician and hospital availability fills quickly toward end of year and needs to have several date options when ready to schedule. Advised will need surgery day plus following day off work.  Patient continues to decline to schedule and states she will call me back. Advised that Dr Talbert Nan would not recommend she delay this procedure for very long.  Routing to provider for final review.

## 2014-10-15 NOTE — Telephone Encounter (Signed)
Please hold on to her chart and check with her next month if you don't hear from her. Thanks

## 2014-10-20 NOTE — Telephone Encounter (Signed)
Follow-up call scheduled for one month. Encounter closed.

## 2014-11-03 ENCOUNTER — Telehealth: Payer: Self-pay | Admitting: *Deleted

## 2014-11-03 NOTE — Telephone Encounter (Signed)
Follow-up call to patient regarding scheduling of surgery. Left message to call back.

## 2014-12-02 NOTE — Telephone Encounter (Signed)
Follow-up call to patient regarding recommendation for surgery. Left message to call back.

## 2014-12-18 NOTE — Telephone Encounter (Signed)
Call to patient to follow-up on recommended procedure due to endometrial polyp. Patient states she had told someone she did not want to schedule. Advised that with known endometrial polyp, follow-up was recommended. This was not something that could not be left untreated/evaluated. Patient then states she has been seen by someone else. Upon further questioning, patient acknowledges she has transferred care but she cant really talk because she is at work. Asked who she has transferred to and patient states she doesn't recall name but she can call back with that information. Requested that she call back with this information so we can complete her chart. Advised we just want to be sure she has appropriate care and we wish her well, it is fine that she transferred as long as she gets the care she needs.  Routing to provider for final review.  Will close encounter.

## 2016-07-21 IMAGING — MG MM DIAG BREAST TOMO BILATERAL
6 of 10 series · 6 of 30 positions shown · non-contrast
Comparison: Baseline exam

CLINICAL DATA: Palpable abnormality in the left breast.

EXAM:
DIGITAL DIAGNOSTIC BILATERAL MAMMOGRAM WITH 3D TOMOSYNTHESIS WITH
CAD
ULTRASOUND LEFT BREAST

[L MLO (1 of 2)]
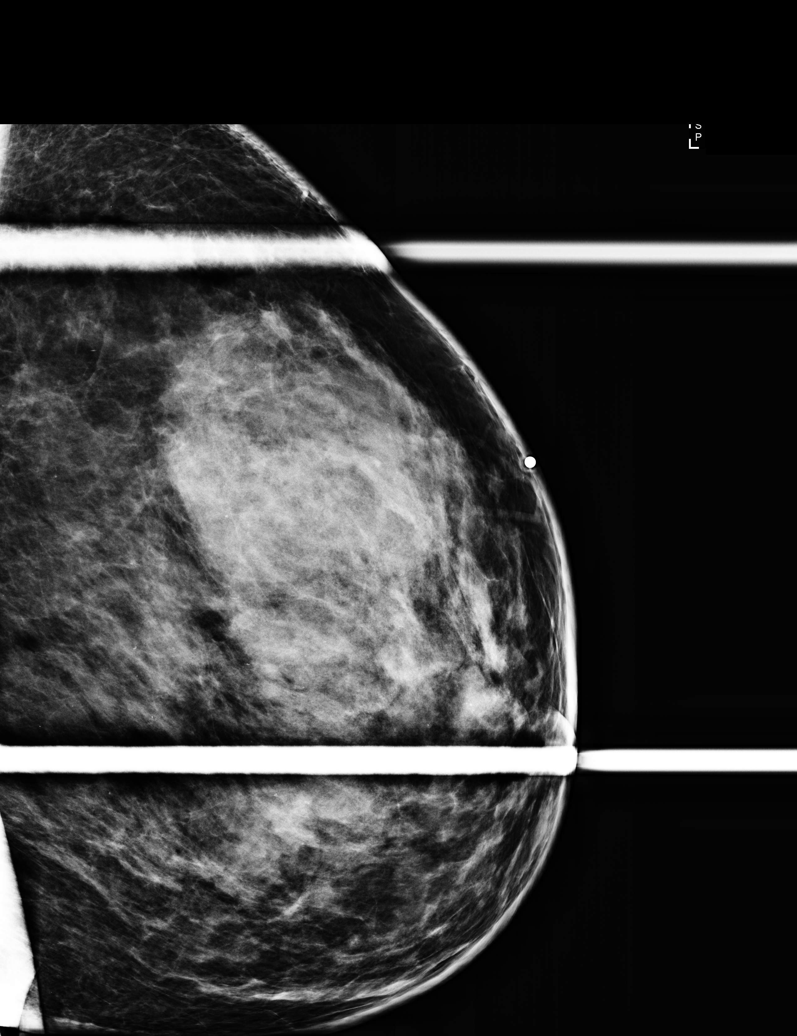

[R MLO]
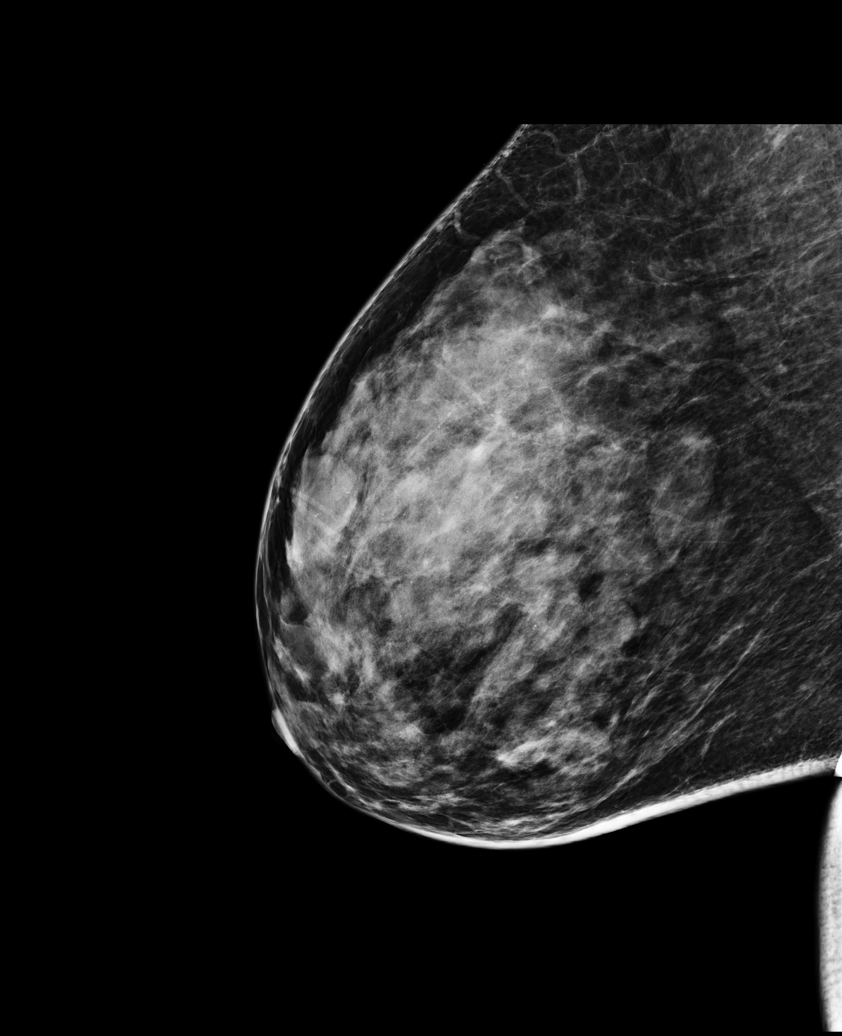

[R CC]
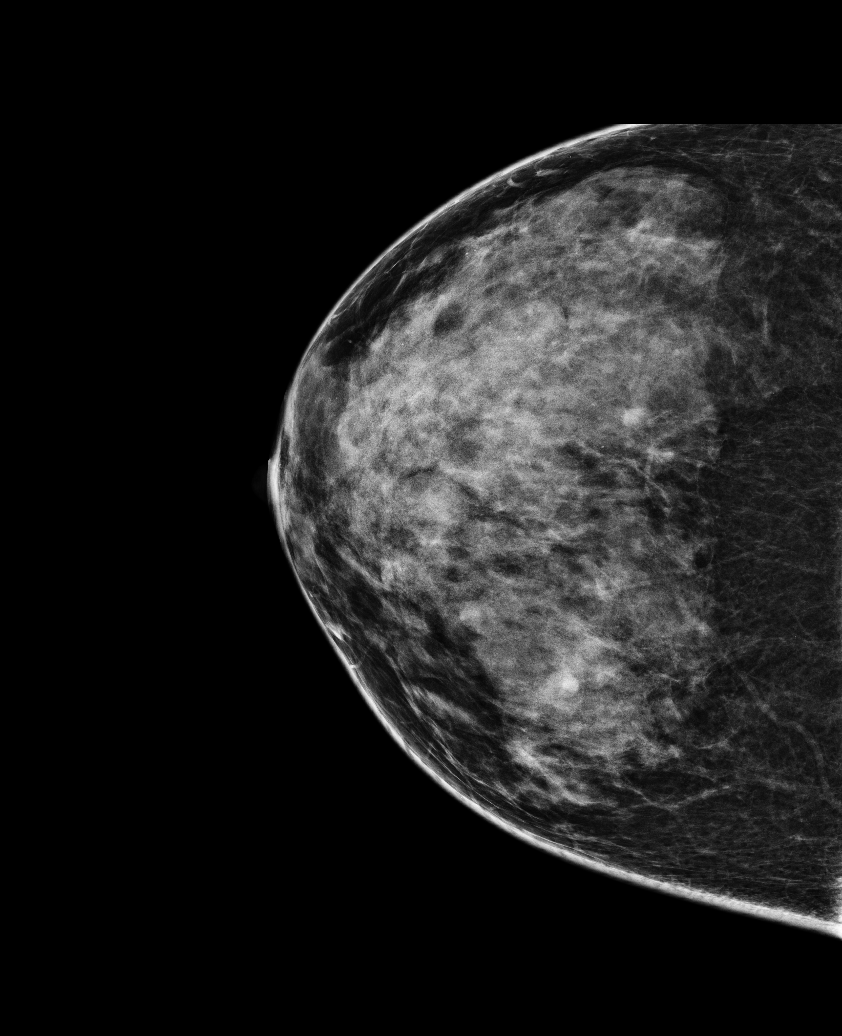

[L MLO (2 of 2)]
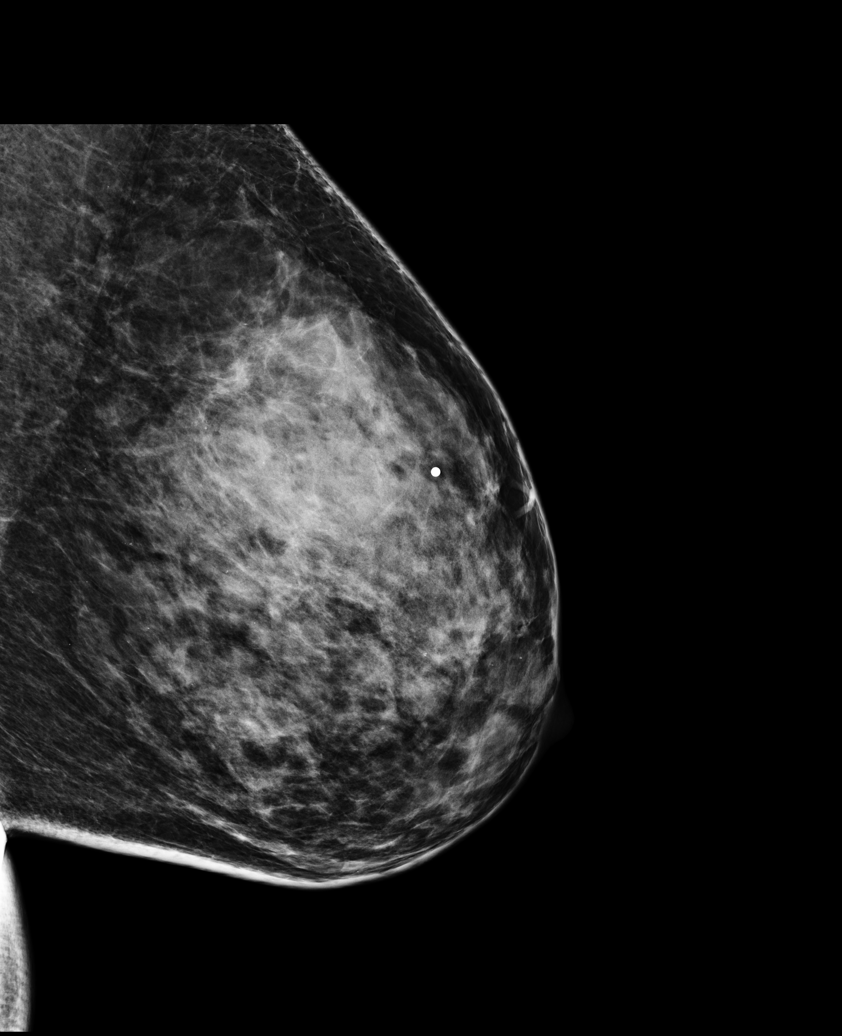

[L CC]
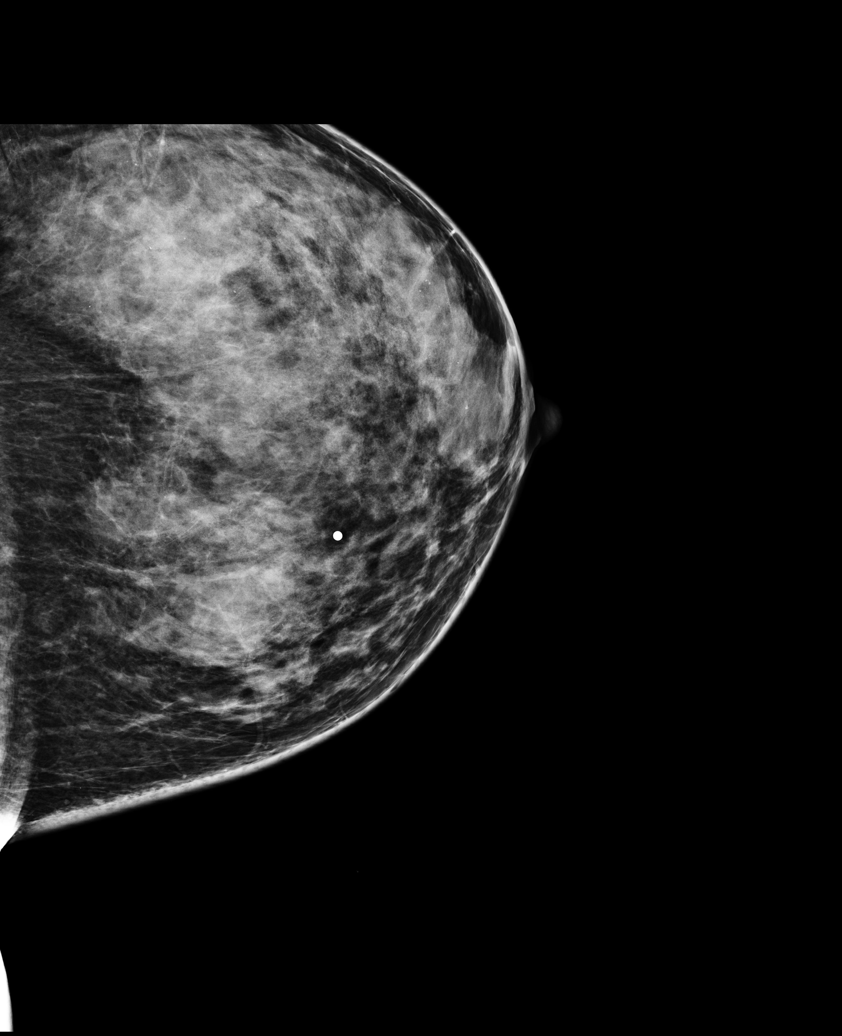

[L MLO tomo · tomo slice 33/65.0]
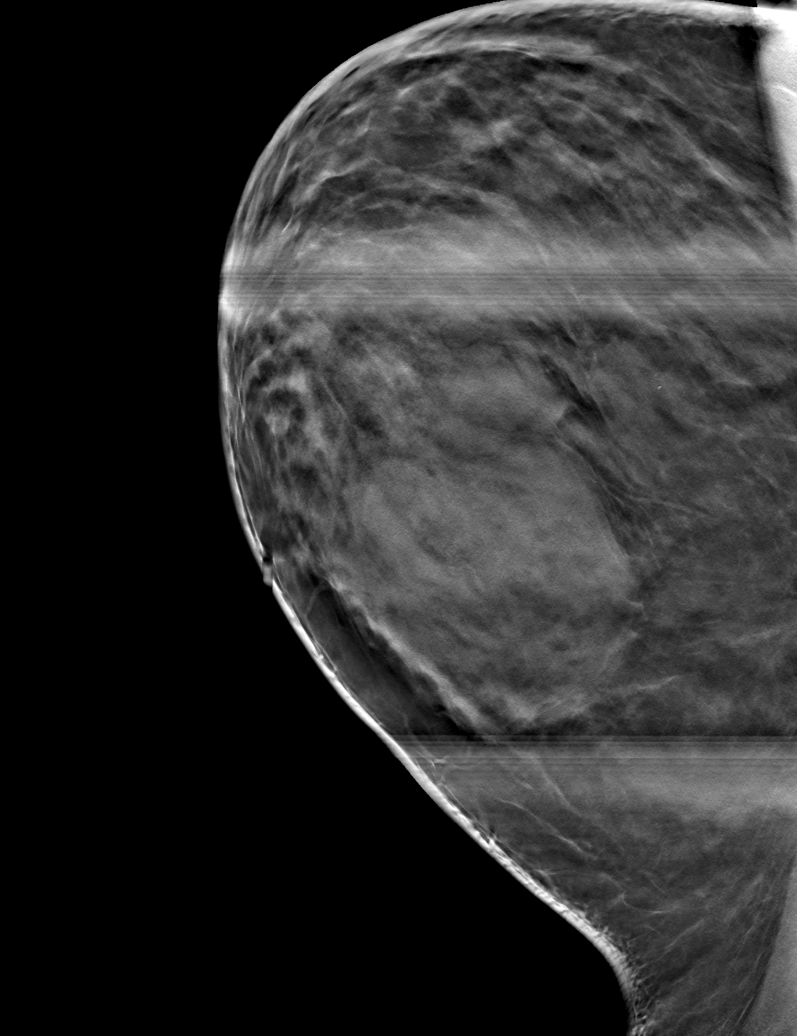

[6 of 30 positions shown; findings below may reference images not displayed]

ACR Breast Density Category d: The breast tissue is extremely dense,
which lowers the sensitivity of mammography.
FINDINGS: No suspicious mass, distortion, or microcalcifications are
identified to suggest presence of malignancy.

Mammographic images were processed with CAD.

On physical exam, I palpate a rounded mobile mass in the 11 o'clock
location of the left breast.

Targeted ultrasound is performed, showing a simple cyst in the 11
o'clock location of the left breast 6 cm from the nipple which
measures 1.8 x 1.3 x 2.2 cm. No solid mass or acoustic shadowing
identified.
IMPRESSION: Palpable abnormality is a simple cyst by ultrasound.

RECOMMENDATION:
Screening mammogram in one year.(Code:E4-U-70W)

I have discussed the findings and recommendations with the patient.
Results were also provided in writing at the conclusion of the
visit. If applicable, a reminder letter will be sent to the patient
regarding the next appointment.

BI-RADS CATEGORY  2: Benign.

## 2016-10-20 ENCOUNTER — Encounter (FREE_STANDING_LABORATORY_FACILITY): Payer: BLUE CROSS/BLUE SHIELD

## 2016-10-20 DIAGNOSIS — R7989 Other specified abnormal findings of blood chemistry: Secondary | ICD-10-CM

## 2016-10-20 DIAGNOSIS — I10 Essential (primary) hypertension: Secondary | ICD-10-CM

## 2016-10-20 DIAGNOSIS — R7309 Other abnormal glucose: Secondary | ICD-10-CM

## 2016-10-20 DIAGNOSIS — Z79899 Other long term (current) drug therapy: Secondary | ICD-10-CM

## 2016-10-20 DIAGNOSIS — E669 Obesity, unspecified: Secondary | ICD-10-CM

## 2016-10-20 LAB — COMPREHENSIVE METABOLIC PANEL
ALT: 53 U/L (ref 0–55)
AST (SGOT): 28 U/L (ref 5–34)
Albumin/Globulin Ratio: 1.2 (ref 0.9–2.2)
Albumin: 4.1 g/dL (ref 3.5–5.0)
Alkaline Phosphatase: 64 U/L (ref 37–106)
BUN: 11 mg/dL (ref 7.0–19.0)
Bilirubin, Total: 0.3 mg/dL (ref 0.1–1.2)
CO2: 28 mEq/L (ref 21–29)
Calcium: 9.9 mg/dL (ref 8.5–10.5)
Chloride: 101 mEq/L (ref 100–111)
Creatinine: 0.8 mg/dL (ref 0.4–1.5)
Globulin: 3.5 g/dL (ref 2.0–3.7)
Glucose: 141 mg/dL — ABNORMAL HIGH (ref 70–100)
Potassium: 4.6 mEq/L (ref 3.5–5.1)
Protein, Total: 7.6 g/dL (ref 6.0–8.3)
Sodium: 136 mEq/L (ref 136–145)

## 2016-10-20 LAB — TSH: TSH: 1.84 u[IU]/mL (ref 0.35–4.94)

## 2016-10-20 LAB — HEMOLYSIS INDEX: Hemolysis Index: 3 (ref 0–18)

## 2016-10-20 LAB — LIPID PANEL
Cholesterol / HDL Ratio: 4.4
Cholesterol: 224 mg/dL — ABNORMAL HIGH (ref 0–199)
HDL: 51 mg/dL (ref 40–9999)
LDL Calculated: 146 mg/dL — ABNORMAL HIGH (ref 0–99)
Triglycerides: 133 mg/dL (ref 34–149)
VLDL Calculated: 27 mg/dL (ref 10–40)

## 2016-10-20 LAB — GFR: EGFR: >60

## 2016-10-21 LAB — HEMOGLOBIN A1C
Average Estimated Glucose: 148.5 mg/dL
Hemoglobin A1C: 6.8 % — ABNORMAL HIGH (ref 4.6–5.9)

## 2017-01-02 ENCOUNTER — Observation Stay (HOSPITAL_COMMUNITY)
Admission: EM | Admit: 2017-01-02 | Discharge: 2017-01-03 | Disposition: A | Discharge status: Home / Self Care | Payer: 59 | Attending: Family Medicine | Admitting: Family Medicine

## 2017-01-02 ENCOUNTER — Encounter (HOSPITAL_COMMUNITY): Payer: Self-pay | Admitting: Emergency Medicine

## 2017-01-02 DIAGNOSIS — D75838 Other thrombocytosis: Secondary | ICD-10-CM | POA: Diagnosis present

## 2017-01-02 DIAGNOSIS — N920 Excessive and frequent menstruation with regular cycle: Secondary | ICD-10-CM | POA: Diagnosis not present

## 2017-01-02 DIAGNOSIS — D649 Anemia, unspecified: Secondary | ICD-10-CM | POA: Diagnosis present

## 2017-01-02 DIAGNOSIS — D473 Essential (hemorrhagic) thrombocythemia: Secondary | ICD-10-CM | POA: Diagnosis not present

## 2017-01-02 DIAGNOSIS — Z79899 Other long term (current) drug therapy: Secondary | ICD-10-CM | POA: Diagnosis not present

## 2017-01-02 DIAGNOSIS — D500 Iron deficiency anemia secondary to blood loss (chronic): Secondary | ICD-10-CM | POA: Diagnosis not present

## 2017-01-02 DIAGNOSIS — D259 Leiomyoma of uterus, unspecified: Secondary | ICD-10-CM | POA: Diagnosis not present

## 2017-01-02 DIAGNOSIS — K649 Unspecified hemorrhoids: Secondary | ICD-10-CM | POA: Diagnosis not present

## 2017-01-02 LAB — RETICULOCYTES
RBC.: 3.09 MIL/uL — AB (ref 3.87–5.11)
RETIC COUNT ABSOLUTE: 37.1 10*3/uL (ref 19.0–186.0)
RETIC CT PCT: 1.2 % (ref 0.4–3.1)

## 2017-01-02 LAB — FERRITIN: FERRITIN: 2 ng/mL — AB (ref 11–307)

## 2017-01-02 LAB — BASIC METABOLIC PANEL
Anion gap: 6 (ref 5–15)
BUN: 11 mg/dL (ref 6–20)
CALCIUM: 8.8 mg/dL — AB (ref 8.9–10.3)
CO2: 25 mmol/L (ref 22–32)
CREATININE: 0.75 mg/dL (ref 0.44–1.00)
Chloride: 105 mmol/L (ref 101–111)
GFR calc Af Amer: >60 mL/min (ref >60)
GFR calc non Af Amer: >60 mL/min (ref >60)
GLUCOSE: 90 mg/dL (ref 65–99)
Potassium: 3.4 mmol/L — ABNORMAL LOW (ref 3.5–5.1)
SODIUM: 136 mmol/L (ref 135–145)

## 2017-01-02 LAB — FOLATE: Folate: 12.6 ng/mL (ref >5.9)

## 2017-01-02 LAB — PROTIME-INR
INR: 1
PROTHROMBIN TIME: 13.1 s (ref 11.4–15.2)

## 2017-01-02 LAB — CBC
HCT: 20.6 % — ABNORMAL LOW (ref 36.0–46.0)
Hemoglobin: 5.7 g/dL — CL (ref 12.0–15.0)
MCH: 19.5 pg — AB (ref 26.0–34.0)
MCHC: 27.7 g/dL — ABNORMAL LOW (ref 30.0–36.0)
MCV: 70.3 fL — AB (ref 78.0–100.0)
PLATELETS: 586 10*3/uL — AB (ref 150–400)
RBC: 2.93 MIL/uL — AB (ref 3.87–5.11)
RDW: 17.8 % — AB (ref 11.5–15.5)
WBC: 6.2 10*3/uL (ref 4.0–10.5)

## 2017-01-02 LAB — IRON AND TIBC
Iron: 18 ug/dL — ABNORMAL LOW (ref 28–170)
Saturation Ratios: 3 % — ABNORMAL LOW (ref 10.4–31.8)
TIBC: 521 ug/dL — AB (ref 250–450)
UIBC: 503 ug/dL

## 2017-01-02 LAB — PREPARE RBC (CROSSMATCH)

## 2017-01-02 LAB — VITAMIN B12: Vitamin B-12: 396 pg/mL (ref 180–914)

## 2017-01-02 LAB — ABO/RH: ABO/RH(D): O POS

## 2017-01-02 LAB — I-STAT BETA HCG BLOOD, ED (MC, WL, AP ONLY)

## 2017-01-02 LAB — POC OCCULT BLOOD, ED: FECAL OCCULT BLD: NEGATIVE

## 2017-01-02 MED ORDER — SODIUM CHLORIDE 0.9 % IV SOLN
10.0000 mL/h | Freq: Once | INTRAVENOUS | Status: AC
  Administered 2017-01-02: 10 mL/h via INTRAVENOUS

## 2017-01-02 MED ORDER — ACETAMINOPHEN 325 MG PO TABS: 650.0000 mg | ORAL_TABLET | Freq: Four times a day (QID) | ORAL | Status: DC | PRN

## 2017-01-02 MED ORDER — ACETAMINOPHEN 650 MG RE SUPP: 650.0000 mg | Freq: Four times a day (QID) | RECTAL | Status: DC | PRN

## 2017-01-02 MED ORDER — ONDANSETRON HCL 4 MG PO TABS: 4.0000 mg | ORAL_TABLET | Freq: Four times a day (QID) | ORAL | Status: DC | PRN

## 2017-01-02 MED ORDER — POLYSACCHARIDE IRON COMPLEX 150 MG PO CAPS
150.0000 mg | ORAL_CAPSULE | Freq: Every day | ORAL | Status: DC
  Administered 2017-01-03: 150 mg via ORAL
  Filled 2017-01-02: qty 1

## 2017-01-02 MED ORDER — ONDANSETRON HCL 4 MG/2ML IJ SOLN: 4.0000 mg | Freq: Four times a day (QID) | INTRAMUSCULAR | Status: DC | PRN

## 2017-01-02 MED ORDER — POLYETHYLENE GLYCOL 3350 17 G PO PACK
17.0000 g | PACK | Freq: Every day | ORAL | Status: DC
  Administered 2017-01-03: 17 g via ORAL
  Filled 2017-01-02: qty 1

## 2017-01-02 MED ORDER — POLYETHYLENE GLYCOL 3350 17 GM/SCOOP PO POWD: 17.0000 g | Freq: Every day | ORAL | Status: DC

## 2017-01-02 MED ORDER — HYDROCORTISONE 2.5 % RE CREA: TOPICAL_CREAM | Freq: Every evening | RECTAL | Status: DC | PRN

## 2017-01-02 MED ORDER — HYDROCORTISONE ACETATE 25 MG RE SUPP: 25.0000 mg | RECTAL | Status: DC | PRN

## 2017-01-02 NOTE — ED Notes (Signed)
ATTEMPTED SECOND IV. NOT SUCCESFUL

## 2017-01-02 NOTE — ED Notes (Signed)
ADMISSION Provider at bedside. 

## 2017-01-02 NOTE — H&P (Signed)
History and Physical   Charle Mclaurin RUE:[id number] DOB: 26-Aug-1975 DOA: 01/02/2017  Referring MD/NP/PA: Monico Blitz, PA, EDP PCP: Leamon Arnt, MD  Patient coming from: Home  Chief Complaint: Anemia  HPI: Sheena Wells is a 42 y.o. female with a history of iron deficiency poorly tolerant of iron, uterine fibroids with menorrhagia, and hemorrhoids who presented to the ED prmopted by her PCP for low hgb level. She established care with a [facility] PCP yesterday, complained of hemorrhoids and only taking iron every other day instead of BID as directed due to stomach upset and constipation. CBC was drawn and returned with critically low hgb. She was called yesterday evening to come to the ED and presented this afternoon. She reports several months of now severe fatigue, lightheadedness worse on standing and mild dyspnea with exertion (still climbing 3 flights of stairs to work). On arrival she was in no distress, though CBC confirmed hgb 5.7 with microcytic indices. Vital signs stable, FOBT negative, transfusions ordered and hospitalist called for evaluation.   Review of Systems: No chest pain, palpitations, melena, hematochezia, N/V or fevers, and per HPI. All others reviewed and are negative.   PMH: Dx iron deficiency anemia at least as far back as 2014. Never needed transfusion or IV iron. Uterine fibroids seen by gyn on hysteroscopy, declined hysterectomy, has regular periods that are heavy 8 days monthly. Takes aleve during this time for cramping. Otherwise doesn't take NSAIDs regularly.   Past Medical History:  Diagnosis Date  . Anemia   . Cholecystitis 06/05/2011  . H/O hiatal hernia   . Hernia    Past Surgical History:  Procedure Laterality Date  . BREAST SURGERY  2002   mass removed from both breasts  . CHOLECYSTECTOMY  06/06/2011   Procedure: LAPAROSCOPIC CHOLECYSTECTOMY WITH INTRAOPERATIVE CHOLANGIOGRAM;  Surgeon: Adin Hector, MD;  Location: [facility];  Service: General;   Laterality: N/A;   - Nonsmoker, very mild drinker, no drugs. Works as Education officer, museum at [employer].   No Known Allergies Family History  Problem Relation Age of Onset  . Hypertension Mother   . Diabetes Mother   . Stroke Maternal Grandmother    - Family history otherwise reviewed and not pertinent.  Prior to Admission medications   Medication Sig Start Date End Date Taking? Authorizing Provider  hydrocortisone (ANUSOL-HC) 25 MG suppository Place 1 suppository rectally as needed for hemorrhoids. 01/01/17  Yes [provider]  iron polysaccharides (NIFEREX) 150 MG capsule Take 150 mg by mouth. 08/27/14 01/02/17 Yes [provider]  naproxen sodium (ALEVE) 220 MG tablet Take 440 mg by mouth as needed (cramps).   Yes [provider]  polyethylene glycol powder (GLYCOLAX/MIRALAX) powder Take 17 g by mouth as needed for constipation. 01/01/17  Yes [provider]  PROCTOSOL HC 2.5 % rectal cream USE AS DIRECTED NIGHTLY AS NEEDED. 08/31/14  Yes [provider]  naproxen sodium (ANAPROX DS) 550 MG tablet 1 tab po q 12 hours prn Patient not taking: Reported on 10/06/2014 08/26/14   Salvadore Dom, MD    Physical Exam: Vitals:   01/02/17 1226 01/02/17 1353  BP: (!) 139/93   Pulse: 88   Resp: 18   Temp: 98.7 F (37.1 C)   TempSrc: Oral   SpO2: 100%   Weight:  85.3 kg (188 lb)  Height:  6' (1.829 m)   Constitutional: 42 y.o. female in no distress, calm demeanor Eyes: Lids and conjunctivae normal, PERRL ENMT: Mucous membranes are moist. Posterior pharynx  clear of any exudate or lesions. Normal dentition.  Neck: normal, supple, no masses, no thyromegaly Respiratory: Non-labored breathing room air without accessory muscle use. Clear breath sounds to auscultation bilaterally Cardiovascular: Regular rate and rhythm, no murmurs, rubs, or gallops. No carotid bruits. No JVD. No LE edema. 2+ pedal pulses. Abdomen: Normoactive bowel sounds. No tenderness,  non-distended, and no masses palpated. No hepatosplenomegaly. GU: No indwelling catheter Musculoskeletal: No clubbing / cyanosis. No joint deformity upper and lower extremities. Good ROM, no contractures. Normal muscle tone.  Skin: Warm, dry. Diffuse pallor but no rashes, wounds, or ulcers. No significant lesions noted.  Neurologic: CN II-XII grossly intact. Gait normal. Speech normal. No focal deficits in motor strength or sensation in all extremities.  Psychiatric: Alert and oriented x3. Normal judgment and insight. Mood anxious mildly with congruent affect.   CBC: Recent Labs  Lab 01/02/17 1236  WBC 6.2  HGB 5.7*  HCT 20.6*  MCV 70.3*  PLT 586*   Assessment/Plan Principal Problem:   Iron deficiency anemia due to chronic blood loss Active Problems:   Symptomatic anemia   Thrombocytosis (HCC)   Hemorrhoids   Symptomatic iron deficiency anemia: History of same, possibly chronic blood loss anemia from menorrhagia. hCG neg. FOBT neg.  - Anemia panel sent prior to transfusions - 2u PRBCs and recheck in AM - Depending on iron panel, would likely benefit from IV iron prior to discharge.  - PCP has already referred pt to GI as outpatient. Since there is no active GI bleed, will not consult as inpatient.   Menorrhagia:  - Consider gyn follow up for further management of menorrhagia from uterine fibroids.   Thrombocytosis: Reactive to iron deficiency.  - Monitor  Hemorrhoids: chronic, stable.  - Continue home suppository prn  DVT prophylaxis: SCDs  Code Status: Full  Family Communication: None at bedside Disposition Plan: DC home in AM if stable/improved.  Consults called: None  Admission status: Observation    Vance Gather, MD [facility] Pager [phone]  If 7PM-7AM, please contact night-coverage [email or web address] Password [email or web address] 01/02/2017, 3:09 PM

## 2017-01-02 NOTE — ED Provider Notes (Signed)
[facility] DEPT Provider Note   CSN: [id number] Arrival date & time: 01/02/17  1209     History   Chief Complaint Chief Complaint  Patient presents with  . Abnormal Lab    HPI   Blood pressure (!) 139/93, pulse 88, temperature 98.7 F (37.1 C), temperature source Oral, resp. rate 18, last menstrual period 12/19/2016, SpO2 100 %.  Sheena Wells is a 42 y.o. female by primary care for evaluation of anemia.  Saw her primary care for hemorrhoids yesterday.  She had blood work taken at that time and received a voicemail to present to the ED for possible transfusion.  Patient reports a generalized fatigue and general headache.  She denies any palpitations, shortness of breath, melena, hematochezia, excessive NSAID use, excessive alcohol use, known liver dysfunction.  She states she is never needed a transfusion in the past, she states that she should be on iron but she does not take them as directed because they were very hard on her stomach.  She states that instead of taking 1 twice a day she takes 1 every other day.  She states that she does have heavy menses and demonstrates for 8 days at a time.  Past Medical History:  Diagnosis Date  . Anemia   . Cholecystitis 06/05/2011  . H/O hiatal hernia   . Hernia     There are no active problems to display for this patient.   Past Surgical History:  Procedure Laterality Date  . BREAST SURGERY  2002   mass removed from both breasts  . CHOLECYSTECTOMY  06/06/2011   Procedure: LAPAROSCOPIC CHOLECYSTECTOMY WITH INTRAOPERATIVE CHOLANGIOGRAM;  Surgeon: Adin Hector, MD;  Location: [place];  Service: General;  Laterality: N/A;    OB History    Gravida Para Term Preterm AB Living   1 1 1     1    SAB TAB Ectopic Multiple Live Births           1       Home Medications    Prior to Admission medications   Medication Sig Start Date End Date Taking? Authorizing Provider  hydrocortisone (ANUSOL-HC) 25 MG  suppository Place 1 suppository rectally as needed for hemorrhoids. 01/01/17  Yes [provider]  iron polysaccharides (NIFEREX) 150 MG capsule Take 150 mg by mouth. 08/27/14 01/02/17 Yes [provider]  naproxen sodium (ALEVE) 220 MG tablet Take 440 mg by mouth as needed (cramps).   Yes [provider]  polyethylene glycol powder (GLYCOLAX/MIRALAX) powder Take 17 g by mouth as needed for constipation. 01/01/17  Yes [provider]  PROCTOSOL HC 2.5 % rectal cream USE AS DIRECTED NIGHTLY AS NEEDED. 08/31/14  Yes [provider]  naproxen sodium (ANAPROX DS) 550 MG tablet 1 tab po q 12 hours prn Patient not taking: Reported on 10/06/2014 08/26/14   Salvadore Dom, MD    Family History Family History  Problem Relation Age of Onset  . Hypertension Mother   . Diabetes Mother   . Stroke Maternal Grandmother     Social History Social History   Tobacco Use  . Smoking status: Never Smoker  . Smokeless tobacco: Never Used  Substance Use Topics  . Alcohol use: Yes    Alcohol/week: 0.0 oz    Comment: occasional  . Drug use: No     Allergies   Patient has no known allergies.   Review of Systems Review of Systems   A complete review of systems was  obtained and all systems are negative except as noted in the HPI and PMH.   Physical Exam Updated Vital Signs BP (!) 139/93 (BP Location: Left Arm)   Pulse 88   Temp 98.7 F (37.1 C) (Oral)   Resp 18   Ht 6' (1.829 m)   Wt 85.3 kg (188 lb)   LMP 12/19/2016   SpO2 100%   BMI 25.50 kg/m   Physical Exam  Constitutional: She is oriented to person, place, and time. She appears well-developed and well-nourished. No distress.  HENT:  Head: Normocephalic and atraumatic.  Mouth/Throat: Oropharynx is clear and moist.  Positive conjunctival pallor  Eyes: Conjunctivae and EOM are normal. Pupils are equal, round, and reactive to light.  Neck: Normal range of motion.  Cardiovascular: Normal  rate, regular rhythm and intact distal pulses.  Pulmonary/Chest: Effort normal and breath sounds normal.  Abdominal: Soft. There is no tenderness.  Genitourinary:  Genitourinary Comments: Digital rectal exam chaperoned by Technician, positive external hemorrhoid, normal rectal tone, normal stool color  Musculoskeletal: Normal range of motion.  Neurological: She is alert and oriented to person, place, and time.  Skin: She is not diaphoretic.  Psychiatric: She has a normal mood and affect.  Nursing note and vitals reviewed.    ED Treatments / Results  Labs (all labs ordered are listed, but only abnormal results are displayed) Labs Reviewed  CBC - Abnormal; Notable for the following components:      Result Value   RBC 2.93 (*)    Hemoglobin 5.7 (*)    HCT 20.6 (*)    MCV 70.3 (*)    MCH 19.5 (*)    MCHC 27.7 (*)    RDW 17.8 (*)    Platelets 586 (*)    All other components within normal limits  VITAMIN B12  FOLATE  IRON AND TIBC  FERRITIN  RETICULOCYTES  OCCULT BLOOD X 1 CARD TO LAB, STOOL  BASIC METABOLIC PANEL  I-STAT BETA HCG BLOOD, ED ([facility], [facility], [facility] ONLY)  POC OCCULT BLOOD, ED  PREPARE RBC (CROSSMATCH)  TYPE AND SCREEN  ABO/RH    EKG  EKG Interpretation None       Radiology No results found.  Procedures Procedures (including critical care time)  CRITICAL CARE Performed by: Monico Blitz   Total critical care time: 35 minutes  Critical care time was exclusive of separately billable procedures and treating other patients.  Critical care was necessary to treat or prevent imminent or life-threatening deterioration.  Critical care was time spent personally by me on the following activities: development of treatment plan with patient and/or surrogate as well as nursing, discussions with consultants, evaluation of patient's response to treatment, examination of patient, obtaining history from patient or surrogate, ordering and performing treatments and  interventions, ordering and review of laboratory studies, ordering and review of radiographic studies, pulse oximetry and re-evaluation of patient's condition.   Medications Ordered in ED Medications  0.9 %  sodium chloride infusion (not administered)     Initial Impression / Assessment and Plan / ED Course  I have reviewed the triage vital signs and the nursing notes.  Pertinent labs & imaging results that were available during my care of the patient were reviewed by me and considered in my medical decision making (see chart for details).     Vitals:   01/02/17 1226 01/02/17 1353  BP: (!) 139/93   Pulse: 88   Resp: 18   Temp: 98.7 F (37.1 C)   TempSrc:  Oral   SpO2: 100%   Weight:  85.3 kg (188 lb)  Height:  6' (1.829 m)    Medications  0.9 %  sodium chloride infusion (not administered)    Sheena Wells is 42 y.o. female presenting with anemia, she has symptoms of fatigue.  Hemoglobin found to be 5.7.  She has a history of chronic anemia and has been noncompliant with her iron.  Guaiac negative, vital signs stable.  To units packed red blood cells ordered, case discussed with [facility] hospitalist who accepts admission.    Final Clinical Impressions(s) / ED Diagnoses   Final diagnoses:  Symptomatic anemia    ED Discharge Orders    None       Karen Kays Charna Elizabeth 01/02/17 [place], MD 01/02/17 1642

## 2017-01-02 NOTE — ED Notes (Signed)
CHELSEA RN AWARE OF PENDING 2UNITS PRBC. BLOOD IS READY

## 2017-01-02 NOTE — ED Notes (Signed)
ED TO INPATIENT HANDOFF REPORT  Name/Age/Gender Sheena Wells 42 y.o. female  Code Status Code Status History    Date Active Date Inactive Code Status Order ID Comments User Context   06/06/2011 14:49 06/07/2011 13:07 Full Code [id number]  Roosvelt Harps, RN Inpatient   06/05/2011 14:32 06/06/2011 14:49 Full Code [id number]  Cheryln Manly, RN ED      Home/SNF/Other Home  Chief Complaint abnormal labs ( dr sent pt )   Level of Care/Admitting Diagnosis ED Disposition    ED Disposition Condition [place]: [facility] [[id number]]  Level of Care: Med-Surg [16]  Diagnosis: Symptomatic anemia [5621308]  Admitting Physician: Patrecia Pour [id number]  Attending Physician: Patrecia Pour [id number]  PT Class (Do Not Modify): Observation [104]  PT Acc Code (Do Not Modify): Observation [10022]       Medical History Past Medical History:  Diagnosis Date  . Anemia   . Cholecystitis 06/05/2011  . H/O hiatal hernia   . Hernia     Allergies No Known Allergies  IV Location/Drains/Wounds Patient Lines/Drains/Airways Status   Active Line/Drains/Airways    Name:   Placement date:   Placement time:   Site:   Days:   Peripheral IV 01/02/17 Left Antecubital   01/02/17    1412    Antecubital   less than 1   Peripheral IV 01/02/17 Right Antecubital   01/02/17    1549    Antecubital   less than 1   Incision 06/06/11 Abdomen Other (Comment)   06/06/11    1309     2037   Incision - 4 Ports Abdomen 1: Umbilicus 2: Mid;Upper 3: Right;Medial 4: Right;Lateral   06/06/11    1302     2037          Labs/Imaging Results for orders placed or performed during the hospital encounter of 01/02/17 (from the past 48 hour(s))  CBC     Status: Abnormal   Collection Time: 01/02/17 12:36 PM  Result Value Ref Range   WBC 6.2 4.0 - 10.5 K/uL   RBC 2.93 (L) 3.87 - 5.11 MIL/uL   Hemoglobin 5.7 (LL) 12.0 - 15.0 g/dL    Comment: RESULT REPEATED AND VERIFIED CRITICAL RESULT  CALLED TO, READ BACK BY AND VERIFIED WITH: TAI,M AT 1310 ON [id number] BY HOOKER,B    HCT 20.6 (L) 36.0 - 46.0 %   MCV 70.3 (L) 78.0 - 100.0 fL   MCH 19.5 (L) 26.0 - 34.0 pg   MCHC 27.7 (L) 30.0 - 36.0 g/dL   RDW 17.8 (H) 11.5 - 15.5 %   Platelets 586 (H) 150 - 400 K/uL  I-Stat beta hCG blood, ED     Status: None   Collection Time: 01/02/17 12:51 PM  Result Value Ref Range   I-stat hCG, quantitative <5.0 <5 mIU/mL   Comment 3            Comment:   GEST. AGE      CONC.  (mIU/mL)   <=1 WEEK        5 - 50     2 WEEKS       50 - 500     3 WEEKS       100 - 10,000     4 WEEKS     1,000 - 30,000        FEMALE AND NON-PREGNANT FEMALE:     LESS THAN 5 mIU/mL   POC occult  blood, ED     Status: None   Collection Time: 01/02/17  1:47 PM  Result Value Ref Range   Fecal Occult Bld NEGATIVE NEGATIVE  Reticulocytes     Status: Abnormal   Collection Time: 01/02/17  1:48 PM  Result Value Ref Range   Retic Ct Pct 1.2 0.4 - 3.1 %   RBC. 3.09 (L) 3.87 - 5.11 MIL/uL   Retic Count, Absolute 37.1 19.0 - 186.0 K/uL  Basic metabolic panel     Status: Abnormal   Collection Time: 01/02/17  1:49 PM  Result Value Ref Range   Sodium 136 135 - 145 mmol/L   Potassium 3.4 (L) 3.5 - 5.1 mmol/L   Chloride 105 101 - 111 mmol/L   CO2 25 22 - 32 mmol/L   Glucose, Bld 90 65 - 99 mg/dL   BUN 11 6 - 20 mg/dL   Creatinine, Ser 0.75 0.44 - 1.00 mg/dL   Calcium 8.8 (L) 8.9 - 10.3 mg/dL   GFR calc non Af Amer >60 >60 mL/min   GFR calc Af Amer >60 >60 mL/min    Comment: (NOTE) The eGFR has been calculated using the CKD EPI equation. This calculation has not been validated in all clinical situations. eGFR's persistently <60 mL/min signify possible Chronic Kidney Disease.    Anion gap 6 5 - 15  Prepare RBC     Status: None   Collection Time: 01/02/17  2:05 PM  Result Value Ref Range   Order Confirmation ORDER PROCESSED BY BLOOD BANK   Type and screen     Status: None (Preliminary result)   Collection Time:  01/02/17  2:05 PM  Result Value Ref Range   ABO/RH(D) O POS    Antibody Screen NEG    Sample Expiration 01/05/2017    Unit Number [id number]    Blood Component Type RBC LR PHER1    Unit division 00    Status of Unit ALLOCATED    Transfusion Status OK TO TRANSFUSE    Crossmatch Result Compatible    Unit Number [id number]    Blood Component Type RBC LR PHER2    Unit division 00    Status of Unit ALLOCATED    Transfusion Status OK TO TRANSFUSE    Crossmatch Result Compatible   ABO/Rh     Status: None   Collection Time: 01/02/17  2:05 PM  Result Value Ref Range   ABO/RH(D) O POS   Protime-INR     Status: None   Collection Time: 01/02/17  3:15 PM  Result Value Ref Range   Prothrombin Time 13.1 11.4 - 15.2 seconds   INR 1.00    No results found.  Pending Labs Unresulted Labs (From admission, onward)   Start     Ordered   01/02/17 1335  Vitamin B12  (Anemia Panel (PNL))  Once,   R     01/02/17 1335   01/02/17 1335  Folate  (Anemia Panel (PNL))  Once,   R     01/02/17 1335   01/02/17 1335  Iron and TIBC  (Anemia Panel (PNL))  Once,   R     01/02/17 1335   01/02/17 1335  Ferritin  (Anemia Panel (PNL))  Once,   R     01/02/17 1335   Signed and Held  CBC  Tomorrow morning,   R     Signed and Held      Vitals/Pain Today's Vitals   01/02/17 1226 01/02/17 1353 01/02/17 1520  BP: Marland Kitchen)  139/93  (!) 143/85  Pulse: 88  91  Resp: 18  11  Temp: 98.7 F (37.1 C)    TempSrc: Oral    SpO2: 100%  100%  Weight:  188 lb (85.3 kg)   Height:  6' (1.829 m)     Isolation Precautions No active isolations  Medications Medications  0.9 %  sodium chloride infusion (not administered)    Mobility walks

## 2017-01-02 NOTE — ED Notes (Signed)
2 units of blood ready for pt, notified Ashley,RN

## 2017-01-02 NOTE — ED Triage Notes (Signed)
Patient reports she was called by PCP last night and told to come to ED for hemoglobin 5.8. Hx anemia. Reports fatigue. Ambulatory.

## 2017-01-03 ENCOUNTER — Other Ambulatory Visit: Payer: Self-pay

## 2017-01-03 DIAGNOSIS — D473 Essential (hemorrhagic) thrombocythemia: Secondary | ICD-10-CM | POA: Diagnosis not present

## 2017-01-03 DIAGNOSIS — D500 Iron deficiency anemia secondary to blood loss (chronic): Secondary | ICD-10-CM | POA: Diagnosis not present

## 2017-01-03 DIAGNOSIS — D649 Anemia, unspecified: Secondary | ICD-10-CM | POA: Diagnosis not present

## 2017-01-03 LAB — CBC
HEMATOCRIT: 24.9 % — AB (ref 36.0–46.0)
Hemoglobin: 7.6 g/dL — ABNORMAL LOW (ref 12.0–15.0)
MCH: 22.2 pg — ABNORMAL LOW (ref 26.0–34.0)
MCHC: 30.5 g/dL (ref 30.0–36.0)
MCV: 72.6 fL — AB (ref 78.0–100.0)
Platelets: 514 10*3/uL — ABNORMAL HIGH (ref 150–400)
RBC: 3.43 MIL/uL — ABNORMAL LOW (ref 3.87–5.11)
RDW: 18 % — ABNORMAL HIGH (ref 11.5–15.5)
WBC: 6.4 10*3/uL (ref 4.0–10.5)

## 2017-01-03 LAB — TYPE AND SCREEN
ABO/RH(D): O POS
ANTIBODY SCREEN: NEGATIVE
Unit division: 0
Unit division: 0

## 2017-01-03 LAB — BPAM RBC
Blood Product Expiration Date: 201901192359
Blood Product Expiration Date: 201901262359
ISSUE DATE / TIME: 201901011630
ISSUE DATE / TIME: 201901012051
UNIT TYPE AND RH: 5100
Unit Type and Rh: 5100

## 2017-01-03 MED ORDER — SODIUM CHLORIDE 0.9 % IV SOLN
510.0000 mg | Freq: Once | INTRAVENOUS | Status: AC
  Administered 2017-01-03: 510 mg via INTRAVENOUS
  Filled 2017-01-03: qty 17

## 2017-01-03 MED ORDER — POLYSACCHARIDE IRON COMPLEX 150 MG PO CAPS: 150.0000 mg | ORAL_CAPSULE | Freq: Two times a day (BID) | ORAL | 0 refills | Status: DC

## 2017-01-03 NOTE — Progress Notes (Signed)
Went over d/c instructions with patient.  She verbalized understanding.  Left hospital with family.

## 2017-01-03 NOTE — Discharge Summary (Signed)
Physician Discharge Summary  Sheena Wells SHF:[id number] DOB: 1975-07-28 DOA: 01/02/2017  PCP: No primary care provider on file.  Admit date: 01/02/2017 Discharge date: 01/03/2017  Admitted From: Home Disposition: Home   Recommendations for Outpatient Follow-up:  1. Follow up with PCP in 1-2 weeks 2. Please obtain CBC in one week  Home Health: None Equipment/Devices: None Discharge Condition: Stable CODE STATUS: Full Diet recommendation: As tolerated  Brief/Interim Summary: Sheena Wells is a 42 y.o. female with a history of iron deficiency poorly tolerant of iron, uterine fibroids with menorrhagia, and hemorrhoids who presented to the ED prompted by her PCP for low hgb level. She established care with a [facility] PCP yesterday, complained of hemorrhoids and only taking iron every other day instead of BID as directed due to stomach upset and constipation. CBC was drawn and returned with critically low hgb. She was called yesterday evening to come to the ED and presented this afternoon. She reports several months of now severe fatigue, lightheadedness worse on standing and mild dyspnea with exertion (still climbing 3 flights of stairs to work). On arrival she was in no distress, though CBC confirmed hgb 5.7 with microcytic indices. Vital signs stable, FOBT negative, transfusions ordered and hospitalist called for evaluation. She was observed after 2u PRBCs given with improvement in hgb to 7.6 and resolution of symptoms. IV iron was administered and she was discharged with plans to start po iron BID and recheck labs w/PCP.   Discharge Diagnoses:  Principal Problem:   Iron deficiency anemia due to chronic blood loss Active Problems:   Symptomatic anemia   Thrombocytosis (HCC)   Hemorrhoids  Symptomatic iron deficiency anemia: History of same, possibly chronic blood loss anemia from menorrhagia. hCG neg. FOBT neg.  - 2u PRBCs given with improvement, IV iron given 1/2.   - PCP has already referred  pt to GI as outpatient. Since there is no active GI bleed, will not consult as inpatient.   Menorrhagia:  - Consider gyn follow up for further management of menorrhagia from uterine fibroids.   Thrombocytosis: Reactive to iron deficiency.  - Monitor  Hemorrhoids: chronic, stable.  - Continue home suppository prn  Discharge Instructions Discharge Instructions    Discharge instructions   Complete by:  As directed    Take iron twice daily with food and take miralax to minimize stomach upset and constipation.  Follow up with your PCP in the next 1 - 2 weeks for recheck of labs, or seek medical care sooner if your symptoms return.     Allergies as of 01/03/2017   No Known Allergies     Medication List    TAKE these medications   hydrocortisone 25 MG suppository Commonly known as:  ANUSOL-HC Place 1 suppository rectally as needed for hemorrhoids.   iron polysaccharides 150 MG capsule Commonly known as:  NIFEREX Take 1 capsule (150 mg total) by mouth 2 (two) times daily. What changed:  when to take this   naproxen sodium 550 MG tablet Commonly known as:  ANAPROX DS 1 tab po q 12 hours prn   naproxen sodium 220 MG tablet Commonly known as:  ALEVE Take 440 mg by mouth as needed (cramps).   polyethylene glycol powder powder Commonly known as:  GLYCOLAX/MIRALAX Take 17 g by mouth as needed for constipation.   PROCTOSOL HC 2.5 % rectal cream Generic drug:  hydrocortisone USE AS DIRECTED NIGHTLY AS NEEDED.      Follow-up Information    Puglisi, Malachy Chamber, FNP. Schedule  an appointment as soon as possible for a visit in 1 week(s).   Specialty:  Nurse Practitioner Contact information: [place]  [id number] [phone]          No Known Allergies  Consultations: None  Procedures/Studies:  2u PRBCs 1/1  Feraheme 1/2  Subjective: No further fatigue or dyspnea, normal exercise tolerance on medical floor today. No dizziness.   Discharge  Exam: Vitals:   01/03/17 0030 01/03/17 0545  BP:  (!) 133/93  Pulse: 84 73  Resp: 18 17  Temp: 98.2 F (36.8 C) 98.7 F (37.1 C)  SpO2: 99% 99%   General: Pt is alert, awake, not in acute distress Cardiovascular: RRR, S1/S2 +, no rubs, no gallops Respiratory: CTA bilaterally, no wheezing, no rhonchi Abdominal: Soft, NT, ND, bowel sounds + Extremities: No edema, no cyanosis  Labs: Basic Metabolic Panel: Recent Labs  Lab 01/02/17 1349  NA 136  K 3.4*  CL 105  CO2 25  GLUCOSE 90  BUN 11  CREATININE 0.75  CALCIUM 8.8*   CBC: Recent Labs  Lab 01/02/17 1236 01/03/17 0532  WBC 6.2 6.4  HGB 5.7* 7.6*  HCT 20.6* 24.9*  MCV 70.3* 72.6*  PLT 586* 514*   Anemia work up Recent Labs    01/02/17 1348  VITAMINB12 396  FOLATE 12.6  FERRITIN 2*  TIBC 521*  IRON 18*  RETICCTPCT 1.2    Time coordinating discharge: Approximately 40 minutes  Vance Gather, MD  [facility] 01/03/2017, 10:07 AM Pager [phone]

## 2017-01-30 ENCOUNTER — Other Ambulatory Visit: Payer: Self-pay | Admitting: Obstetrics and Gynecology

## 2017-01-30 DIAGNOSIS — O09511 Supervision of elderly primigravida, first trimester: Secondary | ICD-10-CM

## 2017-02-15 ENCOUNTER — Ambulatory Visit
Admission: RE | Admit: 2017-02-15 | Discharge: 2017-02-15 | Disposition: A | Discharge status: Home / Self Care | Payer: BLUE CROSS/BLUE SHIELD | Source: Ambulatory Visit | Attending: Obstetrics and Gynecology | Admitting: Obstetrics and Gynecology

## 2017-02-15 ENCOUNTER — Other Ambulatory Visit: Payer: Self-pay | Admitting: Obstetrics and Gynecology

## 2017-02-15 ENCOUNTER — Ambulatory Visit (HOSPITAL_BASED_OUTPATIENT_CLINIC_OR_DEPARTMENT_OTHER)
Admission: RE | Admit: 2017-02-15 | Discharge: 2017-02-15 | Disposition: A | Discharge status: Home / Self Care | Payer: BLUE CROSS/BLUE SHIELD | Source: Ambulatory Visit | Admitting: Maternal and Fetal Medicine

## 2017-02-15 DIAGNOSIS — O09511 Supervision of elderly primigravida, first trimester: Secondary | ICD-10-CM | POA: Insufficient documentation

## 2017-02-15 DIAGNOSIS — O10019 Pre-existing essential hypertension complicating pregnancy, unspecified trimester: Secondary | ICD-10-CM

## 2017-02-15 DIAGNOSIS — O99210 Obesity complicating pregnancy, unspecified trimester: Secondary | ICD-10-CM

## 2017-02-15 DIAGNOSIS — D259 Leiomyoma of uterus, unspecified: Secondary | ICD-10-CM

## 2017-02-15 DIAGNOSIS — O3411 Maternal care for benign tumor of corpus uteri, first trimester: Secondary | ICD-10-CM | POA: Insufficient documentation

## 2017-02-15 DIAGNOSIS — O0991 Supervision of high risk pregnancy, unspecified, first trimester: Secondary | ICD-10-CM | POA: Insufficient documentation

## 2017-02-15 DIAGNOSIS — O09519 Supervision of elderly primigravida, unspecified trimester: Secondary | ICD-10-CM

## 2017-02-15 DIAGNOSIS — O99211 Obesity complicating pregnancy, first trimester: Secondary | ICD-10-CM | POA: Insufficient documentation

## 2017-02-15 DIAGNOSIS — Z3A12 12 weeks gestation of pregnancy: Secondary | ICD-10-CM | POA: Insufficient documentation

## 2017-02-15 DIAGNOSIS — R7302 Impaired glucose tolerance (oral): Secondary | ICD-10-CM

## 2017-02-15 DIAGNOSIS — O3410 Maternal care for benign tumor of corpus uteri, unspecified trimester: Secondary | ICD-10-CM

## 2017-02-15 DIAGNOSIS — E669 Obesity, unspecified: Secondary | ICD-10-CM | POA: Insufficient documentation

## 2017-02-16 ENCOUNTER — Encounter: Payer: Self-pay | Admitting: Maternal and Fetal Medicine

## 2017-02-16 DIAGNOSIS — O09519 Supervision of elderly primigravida, unspecified trimester: Secondary | ICD-10-CM

## 2017-02-16 DIAGNOSIS — O99210 Obesity complicating pregnancy, unspecified trimester: Secondary | ICD-10-CM | POA: Insufficient documentation

## 2017-02-16 DIAGNOSIS — O3410 Maternal care for benign tumor of corpus uteri, unspecified trimester: Secondary | ICD-10-CM

## 2017-02-16 DIAGNOSIS — R7302 Impaired glucose tolerance (oral): Secondary | ICD-10-CM

## 2017-02-16 DIAGNOSIS — D259 Leiomyoma of uterus, unspecified: Secondary | ICD-10-CM

## 2017-02-16 DIAGNOSIS — O10019 Pre-existing essential hypertension complicating pregnancy, unspecified trimester: Secondary | ICD-10-CM

## 2017-02-16 HISTORY — DX: Obesity complicating pregnancy, unspecified trimester: O99.210

## 2017-02-16 HISTORY — DX: Maternal care for benign tumor of corpus uteri, unspecified trimester: O34.10

## 2017-02-16 HISTORY — DX: Impaired glucose tolerance (oral): R73.02

## 2017-02-16 HISTORY — DX: Leiomyoma of uterus, unspecified: D25.9

## 2017-02-16 HISTORY — DX: Morbid (severe) obesity due to excess calories: E66.01

## 2017-02-16 HISTORY — DX: Supervision of elderly primigravida, unspecified trimester: O09.519

## 2017-02-16 HISTORY — DX: Pre-existing essential hypertension complicating pregnancy, unspecified trimester: O10.019

## 2017-02-16 NOTE — Consults (Signed)
CONSULT NOTE    Date Time: 02/15/2017  Patient Name: Ariel Rocha  Requesting Physician: Jena Gauss, MD  [address]  [place], [place] [address]  Consulting Physician: Camelia Phenes, MD    Reason for Consultation: AMA; chronic HTN; Morbid obesity    History of Presenting Illness:     Thank you for requesting a Maternal Fetal Medicine consultation for your patient [name]  due to AMA, chronic HTN, and morbid obesity.  As you know she is a 42 y.o. year old G3 P0020 now at [redacted]w[redacted]d weeks gestation (Estimated Date of Delivery: 08/24/17). CF-DNA genetic screen has yielded reassuring results.    Blood pressure is 145/86 mm Hg.  Vitals:    02/15/17 1505   BP: 145/86   Pulse: 93   Height: 157.5 cm (5\' 2" )   Weight = 250 lb (BMI - 45.7)    Past Obstetric History:     OB History   Gravida Para Term Preterm AB Living   3       2     SAB TAB Ectopic Multiple Live Births     1            # Outcome Date GA Lbr Len/2nd Weight Sex Delivery Anes PTL Lv   3 Current            2 AB  [redacted]w[redacted]d          1 TAB                 Past Medical History:     Past Medical History:   Diagnosis Date   . Hypertension    She was diagnosed 2 years ago. She was treated with Tribenzoar prior to pregnancy (combination of ACE inhibitor, HCTZ, and amlodipine)    Past Surgical History:   History reviewed. No pertinent surgical history.    Family History:     Family History   Problem Relation Age of Onset   . Diabetes Mother    . Heart disease Mother    . Heart attack Mother    . Kidney disease Mother    . Hypertension Father    Negative for increased sources of genetic risk for mental retardation or major anomalies above the background risk of women her age.    Social History:     Social History     Social History   . Marital status: Single     Spouse name: N/A   . Number of children: N/A   . Years of education: N/A     Occupational History   . Not on file.     Social History Main Topics   . Smoking status: Former Smoker     Quit date:  2012   . Smokeless tobacco: Never Used   . Alcohol use No   . Drug use: No   . Sexual activity: Yes     Other Topics Concern   . Not on file     Social History Narrative   . No narrative on file     Allergies:   No Known Allergies    Medications:     Current Outpatient Prescriptions   Medication Sig Dispense Refill   . labetalol (NORMODYNE) 200 MG tablet Take by mouth 2 (two) times daily.     . Prenatal MV-Min-Fe Fum-FA-DHA (PRENATAL 1 PO) Take by mouth.       No current facility-administered medications for this encounter.  Review of Systems:   Negative    Labs:   10/20/2016: glucose 141; HbA1C = 6.8%  ALT = 53; AST = 28  Creatinine = 0.8  TSH = 1.84  Cholesterol = 224    Ultrasound report:   Poor visualization of the fetus even at transvaginal scan due to presence of large fibroids and maternal morbid obesity    Assessment/Plan:      I spent 40 minutes of this 40-minute visit on face to face counseling of [name] on the following:    1. AMA: In the absence of significant medical problems, maternal age >40 years at delivery is  associated with increased risk of placenta previa, low birth weight, stillbirth, and cesarean delivery. AMA is not associated with increased risk of fetal structural anomalies.  As for the increased risk of low birth weight, we have implemented a policy of checking fetal growth at 32 and 36 weeks. Low dose aspirin may also be beneficial in these women, particularly when other risk factors for FGR are present, such as in this case.   As for the increased risk of stillbirth, we recommend weekly NST and AFI starting at 32 weeks. Most experts recommend delivery to be scheduled beteen 39 and 39.6 weeks given residual risk of stillbirth, increased risk of NICU admission, and lower risk of cesarean section with induction of labor at 39 weeks.    2. Chronic HTN: Although her hypertension is of recent onset, it is suboptimally controlled (initial BP at my office was 173/93 mm hg).   Proteinuria was negligible in January 2019. I have explained to the patient that blood pressure usually decreases during the 1st and early 2nd trimester. Given the presence of co-morbidities, it would be optimal if her blood pressure were kept below 140/90 mm Hg. I have encouraged her to check her home BP monitor against a hospital or pharmacy monitor, to verify its accuracy. Labetalol 200 mg BID should be increased to 200 mg TID; maximum dosage is 2000 mg/day. Anti-hypertensive therapy does not confer fetal benefits as perinatal deaths are related to superimposed pre-eclampsia, not hypertension per se.   As for the effects of hypertension on pregnancy, it is associated with increased risk for fetal growth restriction and preeclampsia, independently from the severity of the HTN before 20 weeks of gestation. Indeed, increased incidence of preeclampsia has been reported even with stage 1 HTN (SBP of 130-135 and DBP of 80-85 mm Hg before 24 weeks). Anti-hypertensive therapy does not reduce the risk of preeclampsia or fetal growth restriction.  Recent studies suggest a beneficial preventative effect of low dose aspirin starting in the first trimester for risk of superimposed preeclampsia. Optimal dosage of low dose aspirin is a subject of debate: a high dosage (162 mg daily) is usually recommended for pregnant women, particularly if obese, since 35% of them have resistance to 81 mg of aspirin (but they respond to 162 mg). I have thus instructed her to take 162 mg at bedtime.   I recommend surveillance in the 3rd trimester for fetal growth and well-being. Testing for surveillance of the pregnancy can be tailored to fetal growth (checked at 32 and 36 weeks) and maternal condition.  Delivery is recommended at 37-39 weeks depending on severity of HTN and need for therapy.    3. Morbid Obesity: The patient's obesity puts her at increased risk for stillbirth, thomboembolism, anesthesia complications, preeclampsia and  complications with her delivery. More recent research suggests also life-long effects on the  neonate due to epigenetic modifications. Indeed, maternal pre-pregnancy obesity is associated with:   increased risk for neurodevelopmental disorders in offspring, including cognitive impairment, autism spectrum disorders, attention deficit hyperactivity disorder, and cerebral palsy.   increased risk for psychiatric disorders in offspring, including anxiety and depression, schizophrenia and psychosis, eating disorders, and food addiction.    The risk of pregnancy complications is proportional to the BMI:  - risk of structural anomalies is doubled in morbidly obese women (and ultrasonography is less effective at detecting them). Fetal anatomy survey is scheduled at 20 weeks and a fetal echocardiogram will be done at 24 weeks given also the high HbA1C at conception.  - dietary deficiencies: morbid obesity is a risk factor for deficiencies of iron, vitamin B12 and vitamin D: her CBC is normal at conception.    - liver steathosis: her LFTs are norma.   - risk for preeclampsia is increased in obese women: this adds to the risk based on her age and chronic HTN. She is instructed to start low dose aspirin prophylaxis  - risk of GDM is increased: see below  - risk of macrosomia is increased independently of gestational diabetes: indeed maternal obesity is a stronger predictor of fetal overgrowth than gestational weight gain or development of GDM,   - weight gain:total weight gain during pregnancy should be minimized to <15 lb   - infectious complications are in general more common in obese women (UTIs, chorioamnionitis, endometritis, wound infections). Please make sure she receives higher dose antibiotics (eg cephalosporin 3 gm) in case she requires cesarean section.  - thromboembolism is more common in morbidly obese women: pneumatic stockings are indicated at delivery and in case of hospitalizations or surgeries. For BMI >40  prophylaxis with fractionated heparin is recommended after cesarean delivery (ACOG Practice Bulletin [phone], December 2015).  - risk of stillbirth is doubled in women with BMI >30, and the risk is proportional to BMI. Fetal monitoring will be implemented from 32 weeks on given the co-risk factors  Neonates of obese patients have a significantly increased risk of hypoxic-ischemic encephalopathy and sepsis    4. Glucose intolerance: the elevated random glucose value in October 2018 and the high HbA1C point to either frank pre-gestational diabetes, or severe glucose intolerance. She is scheduled to undergo GTT: if she cannot tolerate it, glucose monitoring should be started 4 tiimes daily and she should be assumed to be diabetic. I recommend a fetal echocardiogram at 24 weeks. If she has DM, insulin should be started because she is unlikely to attain euglycemia with oral medications, given the early GA at diagnosis and maternal morbid obesity.    5. Large fibroids: There is no need to follow the behavior of the myomas during pregnancy.  I have reassured the patient that uterine myomas are not associated with fetal growth restriction; however, large myomas may interfere with clinical assessment of uterine growth: in such cases fetal growth should be followed sonographically at 32 and 36 weeks. Myomas seem also to increases the risk of preterm delivery.   Myomas increase the risk of post-partum bleed, for which she already has the risk factor of morbid obesity. Consideration may be given to ttranexamic acid at cord clamping for prophylaxis, particularly if additional risk factors occur during pregnancy (e.g. Preeclampsia, fetal macrosomia, dpolyhydramnios) or in labor (e.g. High doses of pitocin, need for CS).  Finally, presence of uterine myomas increases the risk of cesarean section even after controlling for other indications for CS.     Plan:  -  start aspirin 162 mg daily  - increase Labetalol to 200 mg TID and start  monitoring BP at home  - rule out DM or start glucose monitoring  - fetal anatomy survey at 20 weeks  - fetal echocardiogram at 24 weeks given elevated HbA1C at conception  - uterine artery Doppler at 24 weeks given HTN  - fetal growth scans monthly after 24 weeks given difficulty at measuring fundal height given obesity and large fibroids  - weekly monitoring of fetal well-being from 32 weeks on  - delivery at 37-39 weeks (earlier if other complications ensue)    Thank you again for your kind referral. If you have questions, please do not hesitate to call me.     Sincerely,    Signed by: Camelia Phenes, MD

## 2017-03-23 ENCOUNTER — Ambulatory Visit
Admission: RE | Admit: 2017-03-23 | Discharge: 2017-03-23 | Disposition: A | Discharge status: Home / Self Care | Payer: BLUE CROSS/BLUE SHIELD | Source: Ambulatory Visit | Attending: Obstetrics and Gynecology | Admitting: Obstetrics and Gynecology

## 2017-03-23 ENCOUNTER — Encounter: Payer: Self-pay | Admitting: Maternal and Fetal Medicine

## 2017-03-23 ENCOUNTER — Other Ambulatory Visit: Payer: Self-pay | Admitting: Obstetrics and Gynecology

## 2017-03-23 DIAGNOSIS — O99212 Obesity complicating pregnancy, second trimester: Secondary | ICD-10-CM | POA: Insufficient documentation

## 2017-03-23 DIAGNOSIS — O99210 Obesity complicating pregnancy, unspecified trimester: Secondary | ICD-10-CM

## 2017-03-23 DIAGNOSIS — O364XX0 Maternal care for intrauterine death, not applicable or unspecified: Secondary | ICD-10-CM | POA: Insufficient documentation

## 2017-03-23 DIAGNOSIS — O09519 Supervision of elderly primigravida, unspecified trimester: Secondary | ICD-10-CM

## 2017-03-23 DIAGNOSIS — O09512 Supervision of elderly primigravida, second trimester: Secondary | ICD-10-CM | POA: Insufficient documentation

## 2017-03-23 DIAGNOSIS — O021 Missed abortion: Secondary | ICD-10-CM

## 2017-03-23 DIAGNOSIS — Z3A18 18 weeks gestation of pregnancy: Secondary | ICD-10-CM | POA: Insufficient documentation

## 2017-03-23 HISTORY — DX: Missed abortion: O02.1

## 2017-03-23 NOTE — Progress Notes (Signed)
PROGRESS NOTE    Date Time: 03/23/17 1:08 PM  Patient Name: Osborne Casco  Requesting Physician: Jena Gauss, MD  [address]  [place], [place] [address]  Consulting Physician: Camelia Phenes, MD    Reason for Note: fetal death    Dear Dr. Cheree Ditto,     During the course of my ultrasound examination on your patient Ms. ANYJAH, [place], a fetal demise was diagnosed. As you know she is 42 y.o. year old G3P0020 now at [redacted]w[redacted]d weeks gestation (Estimated Date of Delivery: 08/24/17). Fetal biometry is consistent with 14 weeks. According to the patient, a FHR was present 2 weeks ago, suggesting that the fetal growth stopped 2 weeks before the fetal demise.    I spent 15 minutes of this 15-minute visit on face to face discussing the following:  Although she has multiple risk factors for fetal death (her age, the morbid obesity, diabetes on insulin), it will be important to establish whether the current demise is due to recurrent causes or not. Therefore I recommend testing the fetus (or placenta) for karyotype and reflex microarray (cf-DNA has yielded reassuring results, but it was limited to trisomy 62, 18 and 21; triploidy could cause fetal death with arrest of growth, and it would not be detected at cf-DNA)), and checking for antiphospholipid antibodies (lupus anticoagulant, cardiolipin antibodies, and beta 2 microglobulin 1 Ab).   I have explained that a comprehensive work-up can establish the cause of death in about 50% of cases.   I have reviewed the natural history of fetal death, and how miscarriage would start clinically at a variable period of time. The patient prefers to induce contractions rather than pursue expectancy.    Thank you again for allowing me to participate in the care of this patient. If you have questions, please do not hesitate to call me.     Sincerely,    Signed by: Camelia Phenes, MD

## 2017-05-01 ENCOUNTER — Inpatient Hospital Stay (FREE_STANDING_LABORATORY_FACILITY): Admit: 2017-05-01 | Payer: BLUE CROSS/BLUE SHIELD | Admitting: Family Medicine

## 2017-05-01 DIAGNOSIS — Z0000 Encounter for general adult medical examination without abnormal findings: Secondary | ICD-10-CM

## 2017-05-01 DIAGNOSIS — R7309 Other abnormal glucose: Secondary | ICD-10-CM

## 2017-05-01 DIAGNOSIS — Z79899 Other long term (current) drug therapy: Secondary | ICD-10-CM

## 2017-05-01 DIAGNOSIS — I10 Essential (primary) hypertension: Secondary | ICD-10-CM

## 2017-05-01 DIAGNOSIS — E669 Obesity, unspecified: Secondary | ICD-10-CM

## 2017-05-01 LAB — CBC AND DIFFERENTIAL
Absolute NRBC: 0 10*3/uL (ref 0.00–0.00)
Basophils Absolute Automated: 0.04 10*3/uL (ref 0.00–0.08)
Basophils Automated: 0.6 %
Eosinophils Absolute Automated: 0.12 10*3/uL (ref 0.00–0.44)
Eosinophils Automated: 1.8 %
Hematocrit: 40.5 % (ref 34.7–43.7)
Hgb: 13.2 g/dL (ref 11.4–14.8)
Immature Granulocytes Absolute: 0.03 10*3/uL (ref 0.00–0.07)
Immature Granulocytes: 0.4 %
Lymphocytes Absolute Automated: 1.92 10*3/uL (ref 0.42–3.22)
Lymphocytes Automated: 28.4 %
MCH: 29.9 pg (ref 25.1–33.5)
MCHC: 32.6 g/dL (ref 31.5–35.8)
MCV: 91.6 fL (ref 78.0–96.0)
MPV: 10.1 fL (ref 8.9–12.5)
Monocytes Absolute Automated: 0.53 10*3/uL (ref 0.21–0.85)
Monocytes: 7.8 %
Neutrophils Absolute: 4.12 10*3/uL (ref 1.10–6.33)
Neutrophils: 61 %
Nucleated RBC: 0 /100 WBC (ref 0.0–0.0)
Platelets: 563 10*3/uL — ABNORMAL HIGH (ref 142–346)
RBC: 4.42 10*6/uL (ref 3.90–5.10)
RDW: 13 % (ref 11–15)
WBC: 6.76 10*3/uL (ref 3.10–9.50)

## 2017-05-01 LAB — LIPID PANEL
Cholesterol / HDL Ratio: 3.7
Cholesterol: 210 mg/dL — ABNORMAL HIGH (ref 0–199)
HDL: 57 mg/dL (ref 40–9999)
LDL Calculated: 122 mg/dL — ABNORMAL HIGH (ref 0–99)
Triglycerides: 154 mg/dL — ABNORMAL HIGH (ref 34–149)
VLDL Calculated: 31 mg/dL (ref 10–40)

## 2017-05-01 LAB — COMPREHENSIVE METABOLIC PANEL
ALT: 23 U/L (ref 0–55)
AST (SGOT): 17 U/L (ref 5–34)
Albumin/Globulin Ratio: 1.1 (ref 0.9–2.2)
Albumin: 4 g/dL (ref 3.5–5.0)
Alkaline Phosphatase: 66 U/L (ref 37–106)
BUN: 12 mg/dL (ref 7.0–19.0)
Bilirubin, Total: 0.5 mg/dL (ref 0.2–1.2)
CO2: 25 mEq/L (ref 21–29)
Calcium: 10 mg/dL (ref 8.5–10.5)
Chloride: 101 mEq/L (ref 100–111)
Creatinine: 0.8 mg/dL (ref 0.4–1.5)
Globulin: 3.7 g/dL (ref 2.0–3.7)
Glucose: 126 mg/dL — ABNORMAL HIGH (ref 70–100)
Potassium: 4.2 mEq/L (ref 3.5–5.1)
Protein, Total: 7.7 g/dL (ref 6.0–8.3)
Sodium: 138 mEq/L (ref 136–145)

## 2017-05-01 LAB — HEMOLYSIS INDEX: Hemolysis Index: 2 (ref 0–18)

## 2017-05-01 LAB — GFR: EGFR: >60

## 2017-05-01 LAB — TSH: TSH: 1.53 u[IU]/mL (ref 0.35–4.94)

## 2017-05-01 LAB — HEMOGLOBIN A1C
Average Estimated Glucose: 111.2 mg/dL
Hemoglobin A1C: 5.5 % (ref 4.6–5.9)

## 2017-07-02 ENCOUNTER — Other Ambulatory Visit: Payer: Self-pay | Admitting: Obstetrics and Gynecology

## 2017-07-02 ENCOUNTER — Other Ambulatory Visit: Payer: Self-pay | Admitting: Nurse Practitioner

## 2017-07-02 DIAGNOSIS — Z1231 Encounter for screening mammogram for malignant neoplasm of breast: Secondary | ICD-10-CM

## 2017-11-16 ENCOUNTER — Encounter (HOSPITAL_BASED_OUTPATIENT_CLINIC_OR_DEPARTMENT_OTHER): Payer: Self-pay

## 2018-05-07 ENCOUNTER — Other Ambulatory Visit: Payer: Self-pay

## 2018-05-07 ENCOUNTER — Other Ambulatory Visit (HOSPITAL_COMMUNITY): Payer: Self-pay | Admitting: *Deleted

## 2018-05-08 ENCOUNTER — Ambulatory Visit (HOSPITAL_COMMUNITY)
Admission: RE | Admit: 2018-05-08 | Discharge: 2018-05-08 | Disposition: A | Discharge status: Home / Self Care | Payer: BLUE CROSS/BLUE SHIELD | Source: Ambulatory Visit | Attending: Obstetrics and Gynecology | Admitting: Obstetrics and Gynecology

## 2018-05-08 DIAGNOSIS — D509 Iron deficiency anemia, unspecified: Secondary | ICD-10-CM | POA: Diagnosis not present

## 2018-05-08 MED ORDER — SODIUM CHLORIDE 0.9 % IV SOLN
510.0000 mg | INTRAVENOUS | Status: DC
  Administered 2018-05-08: 510 mg via INTRAVENOUS
  Filled 2018-05-08: qty 510

## 2018-05-29 ENCOUNTER — Other Ambulatory Visit: Payer: Self-pay

## 2018-05-29 ENCOUNTER — Encounter (HOSPITAL_COMMUNITY)
Admission: RE | Admit: 2018-05-29 | Discharge: 2018-05-29 | Disposition: A | Discharge status: Home / Self Care | Payer: BLUE CROSS/BLUE SHIELD | Source: Ambulatory Visit | Attending: Obstetrics and Gynecology | Admitting: Obstetrics and Gynecology

## 2018-05-29 DIAGNOSIS — D649 Anemia, unspecified: Secondary | ICD-10-CM | POA: Insufficient documentation

## 2018-05-29 MED ORDER — FERUMOXYTOL INJECTION 510 MG/17 ML
510.0000 mg | INTRAVENOUS | Status: AC
  Administered 2018-05-29: 510 mg via INTRAVENOUS
  Filled 2018-05-29: qty 510

## 2019-06-04 ENCOUNTER — Other Ambulatory Visit: Payer: Self-pay | Admitting: Obstetrics and Gynecology

## 2019-06-04 DIAGNOSIS — N6310 Unspecified lump in the right breast, unspecified quadrant: Secondary | ICD-10-CM

## 2019-06-04 DIAGNOSIS — N6320 Unspecified lump in the left breast, unspecified quadrant: Secondary | ICD-10-CM

## 2019-06-16 ENCOUNTER — Ambulatory Visit: Payer: BLUE CROSS/BLUE SHIELD

## 2019-06-16 ENCOUNTER — Other Ambulatory Visit: Payer: Self-pay

## 2020-11-07 ENCOUNTER — Observation Stay (HOSPITAL_COMMUNITY)
Admission: EM | Admit: 2020-11-07 | Discharge: 2020-11-08 | Disposition: A | Discharge status: Home / Self Care | Payer: Managed Care, Other (non HMO) | Attending: Internal Medicine | Admitting: Internal Medicine

## 2020-11-07 ENCOUNTER — Other Ambulatory Visit: Payer: Self-pay

## 2020-11-07 DIAGNOSIS — E876 Hypokalemia: Secondary | ICD-10-CM | POA: Diagnosis not present

## 2020-11-07 DIAGNOSIS — D75838 Other thrombocytosis: Secondary | ICD-10-CM | POA: Insufficient documentation

## 2020-11-07 DIAGNOSIS — D500 Iron deficiency anemia secondary to blood loss (chronic): Principal | ICD-10-CM | POA: Insufficient documentation

## 2020-11-07 DIAGNOSIS — N938 Other specified abnormal uterine and vaginal bleeding: Secondary | ICD-10-CM | POA: Diagnosis present

## 2020-11-07 DIAGNOSIS — D62 Acute posthemorrhagic anemia: Secondary | ICD-10-CM | POA: Diagnosis present

## 2020-11-07 LAB — COMPREHENSIVE METABOLIC PANEL
ALT: 8 U/L (ref 0–44)
AST: 15 U/L (ref 15–41)
Albumin: 4.6 g/dL (ref 3.5–5.0)
Alkaline Phosphatase: 71 U/L (ref 38–126)
Anion gap: 8 (ref 5–15)
BUN: 8 mg/dL (ref 6–20)
CO2: 26 mmol/L (ref 22–32)
Calcium: 9.3 mg/dL (ref 8.9–10.3)
Chloride: 103 mmol/L (ref 98–111)
Creatinine, Ser: 0.78 mg/dL (ref 0.44–1.00)
GFR, Estimated: >60 mL/min (ref >60)
Glucose, Bld: 98 mg/dL (ref 70–99)
Potassium: 3.2 mmol/L — ABNORMAL LOW (ref 3.5–5.1)
Sodium: 137 mmol/L (ref 135–145)
Total Bilirubin: 1.1 mg/dL (ref 0.3–1.2)
Total Protein: 8.5 g/dL — ABNORMAL HIGH (ref 6.5–8.1)

## 2020-11-07 LAB — FOLATE: Folate: 19.2 ng/mL (ref >5.9)

## 2020-11-07 LAB — CBC WITH DIFFERENTIAL/PLATELET
Abs Immature Granulocytes: 0.03 10*3/uL (ref 0.00–0.07)
Basophils Absolute: 0.1 10*3/uL (ref 0.0–0.1)
Basophils Relative: 1 %
Eosinophils Absolute: 0.1 10*3/uL (ref 0.0–0.5)
Eosinophils Relative: 1 %
HCT: 21.1 % — ABNORMAL LOW (ref 36.0–46.0)
Hemoglobin: 5.4 g/dL — CL (ref 12.0–15.0)
Immature Granulocytes: 0 %
Lymphocytes Relative: 19 %
Lymphs Abs: 1.9 10*3/uL (ref 0.7–4.0)
MCH: 16.7 pg — ABNORMAL LOW (ref 26.0–34.0)
MCHC: 25.6 g/dL — ABNORMAL LOW (ref 30.0–36.0)
MCV: 65.3 fL — ABNORMAL LOW (ref 80.0–100.0)
Monocytes Absolute: 0.3 10*3/uL (ref 0.1–1.0)
Monocytes Relative: 3 %
Neutro Abs: 7.8 10*3/uL — ABNORMAL HIGH (ref 1.7–7.7)
Neutrophils Relative %: 76 %
Platelets: 714 10*3/uL — ABNORMAL HIGH (ref 150–400)
RBC: 3.23 MIL/uL — ABNORMAL LOW (ref 3.87–5.11)
RDW: 19.2 % — ABNORMAL HIGH (ref 11.5–15.5)
WBC: 10.2 10*3/uL (ref 4.0–10.5)
nRBC: 0.2 % (ref 0.0–0.2)

## 2020-11-07 LAB — VITAMIN B12: Vitamin B-12: 314 pg/mL (ref 180–914)

## 2020-11-07 LAB — IRON AND TIBC
Iron: 17 ug/dL — ABNORMAL LOW (ref 28–170)
Saturation Ratios: 3 % — ABNORMAL LOW (ref 10.4–31.8)
TIBC: 574 ug/dL — ABNORMAL HIGH (ref 250–450)
UIBC: 557 ug/dL

## 2020-11-07 LAB — RETICULOCYTES
Immature Retic Fract: 18.6 % — ABNORMAL HIGH (ref 2.3–15.9)
RBC.: 2.98 MIL/uL — ABNORMAL LOW (ref 3.87–5.11)
Retic Count, Absolute: 57.2 10*3/uL (ref 19.0–186.0)
Retic Ct Pct: 1.9 % (ref 0.4–3.1)

## 2020-11-07 LAB — URINALYSIS, ROUTINE W REFLEX MICROSCOPIC
Bacteria, UA: NONE SEEN
Bilirubin Urine: NEGATIVE
Glucose, UA: NEGATIVE mg/dL
Hgb urine dipstick: NEGATIVE
Ketones, ur: NEGATIVE mg/dL
Nitrite: NEGATIVE
Protein, ur: NEGATIVE mg/dL
Specific Gravity, Urine: 1.004 — ABNORMAL LOW (ref 1.005–1.030)
pH: 7 (ref 5.0–8.0)

## 2020-11-07 LAB — FERRITIN: Ferritin: 1 ng/mL — ABNORMAL LOW (ref 11–307)

## 2020-11-07 LAB — PREPARE RBC (CROSSMATCH)

## 2020-11-07 LAB — PROTIME-INR
INR: 1 (ref 0.8–1.2)
Prothrombin Time: 12.7 seconds (ref 11.4–15.2)

## 2020-11-07 LAB — POC OCCULT BLOOD, ED: Fecal Occult Bld: NEGATIVE

## 2020-11-07 LAB — PREGNANCY, URINE: Preg Test, Ur: NEGATIVE

## 2020-11-07 MED ORDER — ONDANSETRON 4 MG PO TBDP: 8.0000 mg | ORAL_TABLET | Freq: Once | ORAL | Status: DC

## 2020-11-07 MED ORDER — SODIUM CHLORIDE 0.9% IV SOLUTION: Freq: Once | INTRAVENOUS | Status: AC

## 2020-11-07 NOTE — ED Notes (Signed)
Blood consent signed by pt, verbalized understanding. Witnessed by this RN

## 2020-11-07 NOTE — ED Triage Notes (Signed)
Patient reports she was showering today, became dizzy and nauseated, threw up yellow substance. Says she is "dealing with iron issues" and doesn't know if its related. Denies pain in triage

## 2020-11-07 NOTE — ED Provider Notes (Signed)
[facility] DEPT Provider Note   CSN: [id number] Arrival date & time: 11/07/20  1119     History Chief Complaint  Patient presents with   Emesis    Sheena Wells is a 45 y.o. female.  Patient with a past medical history of iron deficiency anemia requiring blood transfusions, thrombocytosis, Menorrhagia, and hemorrhoids.  Patient presents to the emergency department today due to being dizzy and nauseated.  She said while she was showering earlier today she became suddenly dizzy.  She got out of the shower and got very nauseous and threw up which she describes as being a yellow substance.  She has a history of iron deficiency anemia that is previously required blood transfusions.  She has been tapering back on her iron supplement and has not been taking this regularly.  She has not been to see her PCP and about 2 years who was previously been following her anemia.  It was thought that anemia was caused by menorrhagia.  Patient has regular periods, however they are very heavy.  States she recently had a period.  She endorses some shortness of breath with exertion has been ongoing for couple months now.  She denies any hematochezia, melena, or hematemesis.  Denies any abdominal pain.  Denies chest pain.    Emesis Associated symptoms: no abdominal pain, no chills, no cough, no diarrhea, no fever, no headaches and no sore throat       Past Medical History:  Diagnosis Date   Anemia    Cholecystitis 06/05/2011   H/O hiatal hernia    Hernia     Patient Active Problem List   Diagnosis Date Noted   Acute on chronic blood loss anemia 11/08/2020   Iron deficiency anemia due to chronic blood loss 01/02/2017   Symptomatic anemia 01/02/2017   Thrombocytosis 01/02/2017   Hemorrhoids 01/02/2017    Past Surgical History:  Procedure Laterality Date   BREAST SURGERY  2002   mass removed from both breasts   CHOLECYSTECTOMY  06/06/2011   Procedure: LAPAROSCOPIC  CHOLECYSTECTOMY WITH INTRAOPERATIVE CHOLANGIOGRAM;  Surgeon: Adin Hector, MD;  Location: [facility];  Service: General;  Laterality: N/A;     OB History     Gravida  1   Para  1   Term  1   Preterm      AB      Living  1      SAB      IAB      Ectopic      Multiple      Live Births  1           Family History  Problem Relation Age of Onset   Hypertension Mother    Diabetes Mother    Stroke Maternal Grandmother     Social History   Tobacco Use   Smoking status: Never   Smokeless tobacco: Never  Substance Use Topics   Alcohol use: Yes    Alcohol/week: 0.0 standard drinks    Comment: occasional   Drug use: No    Home Medications Prior to Admission medications   Medication Sig Start Date End Date Taking? Authorizing Provider  Cholecalciferol (VITAMIN D3 SUPER STRENGTH) 50 MCG (2000 UT) TABS Take 2,000 Units by mouth once.   Yes [provider]  ibuprofen (ADVIL) 200 MG tablet Take 400 mg by mouth daily as needed (cramps/ menstrual pain).   Yes [provider]  Multiple Vitamin (MULTIVITAMIN WITH MINERALS) TABS tablet Take 2  tablets by mouth every morning.   Yes [provider]  polyethylene glycol powder (GLYCOLAX/MIRALAX) powder Take 17 g by mouth daily as needed for constipation. 01/01/17  Yes [provider]  iron polysaccharides (NIFEREX) 150 MG capsule Take 1 capsule (150 mg total) by mouth 2 (two) times daily. Patient not taking: Reported on 11/07/2020 01/03/17   Patrecia Pour, MD    Allergies    Amoxicillin  Review of Systems   Review of Systems  Constitutional:  Positive for fatigue. Negative for chills and fever.  HENT:  Negative for congestion, rhinorrhea and sore throat.   Eyes:  Negative for visual disturbance.  Respiratory:  Negative for cough, chest tightness and shortness of breath.   Cardiovascular:  Negative for chest pain, palpitations and leg swelling.  Gastrointestinal:  Positive for nausea and  vomiting. Negative for abdominal pain, blood in stool, constipation and diarrhea.  Genitourinary:  Positive for menstrual problem. Negative for dysuria, flank pain and hematuria.  Musculoskeletal:  Negative for back pain.  Skin:  Negative for rash and wound.  Neurological:  Positive for dizziness. Negative for syncope, weakness, light-headedness and headaches.  Psychiatric/Behavioral:  Negative for confusion.   All other systems reviewed and are negative.  Physical Exam Updated Vital Signs BP 126/83   Pulse 90   Temp 99 F (37.2 C) (Oral)   Resp 15   Ht 6' (1.829 m)   Wt 90.7 kg   LMP 10/27/2020   SpO2 98%   BMI 27.12 kg/m   Physical Exam Vitals and nursing note reviewed.  Constitutional:      General: She is not in acute distress.    Appearance: Normal appearance. She is not ill-appearing, toxic-appearing or diaphoretic.  HENT:     Head: Normocephalic and atraumatic.     Nose: No nasal deformity.     Mouth/Throat:     Lips: Pink. No lesions.     Mouth: Mucous membranes are moist. No injury, lacerations, oral lesions or angioedema.     Pharynx: Oropharynx is clear. Uvula midline. No pharyngeal swelling, oropharyngeal exudate, posterior oropharyngeal erythema or uvula swelling.  Eyes:     General: Gaze aligned appropriately. No scleral icterus.       Right eye: No discharge.        Left eye: No discharge.     Conjunctiva/sclera: Conjunctivae normal.     Right eye: Right conjunctiva is not injected. No exudate or hemorrhage.    Left eye: Left conjunctiva is not injected. No exudate or hemorrhage. Cardiovascular:     Rate and Rhythm: Normal rate and regular rhythm.     Pulses: Normal pulses.          Radial pulses are 2+ on the right side and 2+ on the left side.       Dorsalis pedis pulses are 2+ on the right side and 2+ on the left side.     Heart sounds: Normal heart sounds, S1 normal and S2 normal. Heart sounds not distant. No murmur heard.   No friction rub. No  gallop. No S3 or S4 sounds.  Pulmonary:     Effort: Pulmonary effort is normal. No accessory muscle usage or respiratory distress.     Breath sounds: Normal breath sounds. No stridor. No wheezing, rhonchi or rales.  Chest:     Chest wall: No tenderness.  Abdominal:     General: Abdomen is flat. Bowel sounds are normal. There is no distension.     Palpations: Abdomen is  soft. There is no mass or pulsatile mass.     Tenderness: There is no abdominal tenderness. There is no guarding or rebound.  Musculoskeletal:     Right lower leg: No edema.     Left lower leg: No edema.  Skin:    General: Skin is warm and dry.     Coloration: Skin is not jaundiced or pale.     Findings: No bruising, erythema, lesion or rash.  Neurological:     General: No focal deficit present.     Mental Status: She is alert and oriented to person, place, and time.     GCS: GCS eye subscore is 4. GCS verbal subscore is 5. GCS motor subscore is 6.  Psychiatric:        Mood and Affect: Mood normal.        Behavior: Behavior normal. Behavior is cooperative.    ED Results / Procedures / Treatments   Labs (all labs ordered are listed, but only abnormal results are displayed) Labs Reviewed  URINALYSIS, ROUTINE W REFLEX MICROSCOPIC - Abnormal; Notable for the following components:      Result Value   Color, Urine STRAW (*)    Specific Gravity, Urine 1.004 (*)    Leukocytes,Ua SMALL (*)    All other components within normal limits  COMPREHENSIVE METABOLIC PANEL - Abnormal; Notable for the following components:   Potassium 3.2 (*)    Total Protein 8.5 (*)    All other components within normal limits  CBC WITH DIFFERENTIAL/PLATELET - Abnormal; Notable for the following components:   RBC 3.23 (*)    Hemoglobin 5.4 (*)    HCT 21.1 (*)    MCV 65.3 (*)    MCH 16.7 (*)    MCHC 25.6 (*)    RDW 19.2 (*)    Platelets 714 (*)    Neutro Abs 7.8 (*)    All other components within normal limits  IRON AND TIBC - Abnormal;  Notable for the following components:   Iron 17 (*)    TIBC 574 (*)    Saturation Ratios 3 (*)    All other components within normal limits  FERRITIN - Abnormal; Notable for the following components:   Ferritin 1 (*)    All other components within normal limits  RETICULOCYTES - Abnormal; Notable for the following components:   RBC. 2.98 (*)    Immature Retic Fract 18.6 (*)    All other components within normal limits  CBC - Abnormal; Notable for the following components:   RBC 2.90 (*)    Hemoglobin 5.5 (*)    HCT 19.6 (*)    MCV 67.6 (*)    MCH 19.0 (*)    MCHC 28.1 (*)    RDW 21.0 (*)    Platelets 573 (*)    All other components within normal limits  PREGNANCY, URINE  VITAMIN B12  FOLATE  PROTIME-INR  POC OCCULT BLOOD, ED  TYPE AND SCREEN  PREPARE RBC (CROSSMATCH)    EKG None  Radiology No results found.  Procedures Procedures   Medications Ordered in ED Medications  ondansetron (ZOFRAN-ODT) disintegrating tablet 8 mg (0 mg Oral Hold 11/07/20 1744)  0.9 %  sodium chloride infusion (Manually program via Guardrails IV Fluids) (0 mLs Intravenous Stopped 11/07/20 2302)    ED Course  I have reviewed the triage vital signs and the nursing notes.  Pertinent labs & imaging results that were available during my care of the patient were reviewed by me  and considered in my medical decision making (see chart for details).  Clinical Course as of 11/08/20 0014  Nancy Fetter Nov 07, 2020  1809 Fecal Occult Blood, POC: NEGATIVE [GL]  1923 Orthostatics negative. Asymptomatic.  [GL]    Clinical Course User Index [GL] Hala Narula, Adora Fridge, PA-C   MDM Rules/Calculators/A&P                          Patient presents the emergency department with complaints of dizziness, nausea, and vomiting.  She was hemodynamically stable.  EKG shows sinus tachycardia to 105.  Initial labs were obtained in triage.  She is hypokalemic to 3.2.  She is also found to have hemoglobin of 5.4, appears to be  microcytic anemia. Baseline Hgb 7.6.  On chart review, it appears that she was admitted previously in 2019 for symptomatic anemia.  At that time, she received 2 units of packed red blood cells, and was able to be discharged home with follow-up to manage her anemia.  It is thought that her anemia is caused by menorrhagia, given no other obvious cause.  On my exam, patient is completely asymptomatic. No obvious signs of bleeding. I ordered anemia labs and type and screen.  Patient will need at least 1 unit of blood.  Since patient is asymptomatic at this time, will consider discharge after 1 unit if not orthostatic, Hemoccult positive, or symptomatic with ambulation.  If 2 units is required, will likely admit.  @1757 , Hemoccult negative with negative orthostatics.   No obvious signs of bleeding. Doubt GI source given no melena, hematochezia, or hematemesis. Abdominal exam benign. No other obvious source of bleeding other than menorrhagia from recent period. Anemia labs with no hemolysis. Ferritin is < 1 and Iron 17. B12 and folate normal. Suspect this is from chronic IDA and menorrhagia as chronic blood loss.   @2300 , Following blood transfusion, ambulated patient around the unit with no symptoms of recurrence. Awaiting CBC prior to considering discharge.   CBC returns with Hgb of 5.5. Discussed this with the patient with need for likely admission and continued blood transfusion.   @1201 , Discussed case with Dr. Cyd Silence who will admit patient for further management of anemia.   Final Clinical Impression(s) / ED Diagnoses Final diagnoses:  Iron deficiency anemia due to chronic blood loss    Rx / DC Orders ED Discharge Orders          Ordered    Ambulatory referral to Hematology / Oncology        11/07/20 Calvert, [place], PA-C 11/08/20 0014    Horton, Alvin Critchley, DO 11/10/20 1439

## 2020-11-07 NOTE — ED Notes (Signed)
Unable to print lab labels, error message. Pt chart label used, verified with pt name/DOB.

## 2020-11-07 NOTE — ED Notes (Signed)
Patient placed on cardiac monitor. Patient is now eating dinner. Called lab, blood is not ready.

## 2020-11-07 NOTE — ED Provider Notes (Signed)
Emergency Medicine Provider Triage Evaluation Note  Sheena Wells , a 45 y.o. female  was evaluated in triage.  Pt complains of 1 episode of yellow emesis this morning.  Also feels nauseated and generally weak.  Has had some diarrhea, denies any abdominal pain.  Concerned that her iron is low because she stopped taking her iron supplements..  Review of Systems  Positive: Nausea, emesis, dizziness Negative: Abdominal pain  Physical Exam  BP (!) 149/89 (BP Location: Left Arm)   Pulse 97   Temp 98.4 F (36.9 C) (Oral)   Resp 16   Ht 6' (1.829 m)   Wt 90.7 kg   LMP 10/27/2020   SpO2 100%   BMI 27.12 kg/m  Gen:   Awake, no distress   Resp:  Normal effort  MSK:   Moves extremities without difficulty  Other:  Abdomen soft and nontender  Medical Decision Making  Medically screening exam initiated at 12:16 PM.  Appropriate orders placed.  Suttyn Cryder was informed that the remainder of the evaluation will be completed by another provider, this initial triage assessment does not replace that evaluation, and the importance of remaining in the ED until their evaluation is complete.  Basic labs, urine pride.   Sherrill Raring, PA-C 11/07/20 1217    Varney Biles, MD 11/07/20 1418

## 2020-11-08 ENCOUNTER — Encounter (HOSPITAL_COMMUNITY): Payer: Self-pay | Admitting: Internal Medicine

## 2020-11-08 DIAGNOSIS — D75838 Other thrombocytosis: Secondary | ICD-10-CM | POA: Diagnosis not present

## 2020-11-08 DIAGNOSIS — D62 Acute posthemorrhagic anemia: Secondary | ICD-10-CM

## 2020-11-08 DIAGNOSIS — N938 Other specified abnormal uterine and vaginal bleeding: Secondary | ICD-10-CM | POA: Diagnosis present

## 2020-11-08 DIAGNOSIS — E876 Hypokalemia: Secondary | ICD-10-CM | POA: Diagnosis present

## 2020-11-08 LAB — CBC
HCT: 19.6 % — ABNORMAL LOW (ref 36.0–46.0)
HCT: 27.2 % — ABNORMAL LOW (ref 36.0–46.0)
Hemoglobin: 5.5 g/dL — CL (ref 12.0–15.0)
Hemoglobin: 8 g/dL — ABNORMAL LOW (ref 12.0–15.0)
MCH: 19 pg — ABNORMAL LOW (ref 26.0–34.0)
MCH: 20.2 pg — ABNORMAL LOW (ref 26.0–34.0)
MCHC: 28.1 g/dL — ABNORMAL LOW (ref 30.0–36.0)
MCHC: 29.4 g/dL — ABNORMAL LOW (ref 30.0–36.0)
MCV: 67.6 fL — ABNORMAL LOW (ref 80.0–100.0)
MCV: 68.7 fL — ABNORMAL LOW (ref 80.0–100.0)
Platelets: 573 10*3/uL — ABNORMAL HIGH (ref 150–400)
Platelets: 526 10*3/uL — ABNORMAL HIGH (ref 150–400)
RBC: 2.9 MIL/uL — ABNORMAL LOW (ref 3.87–5.11)
RBC: 3.96 MIL/uL (ref 3.87–5.11)
RDW: 21 % — ABNORMAL HIGH (ref 11.5–15.5)
RDW: 21.2 % — ABNORMAL HIGH (ref 11.5–15.5)
WBC: 8.2 10*3/uL (ref 4.0–10.5)
WBC: 7.6 10*3/uL (ref 4.0–10.5)
nRBC: 0.2 % (ref 0.0–0.2)
nRBC: 0.9 % — ABNORMAL HIGH (ref 0.0–0.2)

## 2020-11-08 LAB — BASIC METABOLIC PANEL
Anion gap: 5 (ref 5–15)
BUN: 10 mg/dL (ref 6–20)
CO2: 26 mmol/L (ref 22–32)
Calcium: 8.7 mg/dL — ABNORMAL LOW (ref 8.9–10.3)
Chloride: 106 mmol/L (ref 98–111)
Creatinine, Ser: 0.86 mg/dL (ref 0.44–1.00)
GFR, Estimated: >60 mL/min (ref >60)
Glucose, Bld: 102 mg/dL — ABNORMAL HIGH (ref 70–99)
Potassium: 3.4 mmol/L — ABNORMAL LOW (ref 3.5–5.1)
Sodium: 137 mmol/L (ref 135–145)

## 2020-11-08 LAB — PREPARE RBC (CROSSMATCH)

## 2020-11-08 MED ORDER — POTASSIUM CHLORIDE CRYS ER 20 MEQ PO TBCR
40.0000 meq | EXTENDED_RELEASE_TABLET | Freq: Once | ORAL | Status: AC
  Administered 2020-11-08: 40 meq via ORAL
  Filled 2020-11-08: qty 2

## 2020-11-08 MED ORDER — ACETAMINOPHEN 325 MG PO TABS
650.0000 mg | ORAL_TABLET | Freq: Four times a day (QID) | ORAL | Status: DC | PRN
  Administered 2020-11-08: 650 mg via ORAL
  Filled 2020-11-08: qty 2

## 2020-11-08 MED ORDER — SODIUM CHLORIDE 0.9% IV SOLUTION: Freq: Once | INTRAVENOUS | Status: AC

## 2020-11-08 MED ORDER — POLYSACCHARIDE IRON COMPLEX 150 MG PO CAPS: 150.0000 mg | ORAL_CAPSULE | Freq: Two times a day (BID) | ORAL | Status: DC

## 2020-11-08 MED ORDER — POLYSACCHARIDE IRON COMPLEX 150 MG PO CAPS: 150.0000 mg | ORAL_CAPSULE | Freq: Two times a day (BID) | ORAL | 1 refills | Status: DC

## 2020-11-08 MED ORDER — ACETAMINOPHEN 650 MG RE SUPP: 650.0000 mg | Freq: Four times a day (QID) | RECTAL | Status: DC | PRN

## 2020-11-08 MED ORDER — ACETAMINOPHEN 325 MG PO TABS: 650.0000 mg | ORAL_TABLET | Freq: Four times a day (QID) | ORAL | Status: DC | PRN

## 2020-11-08 MED ORDER — ACETAMINOPHEN 325 MG PO TABS
650.0000 mg | ORAL_TABLET | Freq: Four times a day (QID) | ORAL | Status: DC | PRN
Start: 2020-11-08 — End: 2023-01-29

## 2020-11-08 MED ORDER — SODIUM CHLORIDE 0.9 % IV SOLN
250.0000 mg | Freq: Once | INTRAVENOUS | Status: AC
  Administered 2020-11-08: 250 mg via INTRAVENOUS
  Filled 2020-11-08: qty 20

## 2020-11-08 MED ORDER — ONDANSETRON HCL 4 MG PO TABS: 4.0000 mg | ORAL_TABLET | Freq: Four times a day (QID) | ORAL | Status: DC | PRN

## 2020-11-08 MED ORDER — CYANOCOBALAMIN 1000 MCG/ML IJ SOLN
1000.0000 ug | Freq: Once | INTRAMUSCULAR | Status: AC
  Administered 2020-11-08: 1000 ug via SUBCUTANEOUS
  Filled 2020-11-08: qty 1

## 2020-11-08 MED ORDER — ALUM & MAG HYDROXIDE-SIMETH 200-200-20 MG/5ML PO SUSP
30.0000 mL | ORAL | Status: DC | PRN
  Administered 2020-11-08: 30 mL via ORAL
  Filled 2020-11-08: qty 30

## 2020-11-08 MED ORDER — ONDANSETRON HCL 4 MG/2ML IJ SOLN: 4.0000 mg | Freq: Four times a day (QID) | INTRAMUSCULAR | Status: DC | PRN

## 2020-11-08 MED ORDER — POLYETHYLENE GLYCOL 3350 17 G PO PACK
17.0000 g | PACK | Freq: Every day | ORAL | Status: DC | PRN
  Administered 2020-11-08: 17 g via ORAL
  Filled 2020-11-08: qty 1

## 2020-11-08 MED ORDER — POLYETHYLENE GLYCOL 3350 17 G PO PACK: 17.0000 g | PACK | Freq: Every day | ORAL | Status: DC | PRN

## 2020-11-08 NOTE — Assessment & Plan Note (Signed)
   Longstanding history of dysfunctional uterine bleeding  We will try to reestablish patient with her outpatient provider at time of discharge for definitive therapy for this issue

## 2020-11-08 NOTE — Assessment & Plan Note (Signed)
·   Replacing with potassium chloride °· Evaluating for concurrent hypomagnesemia  °· Monitoring potassium levels with serial chemistries. ° °

## 2020-11-08 NOTE — Assessment & Plan Note (Signed)
   Substantial reactive thrombocytosis likely secondary to profound and longstanding iron deficiency anemia  Monitoring with serial CBCs, supportive care.

## 2020-11-08 NOTE — ED Notes (Signed)
Patient is ready for transport.  

## 2020-11-08 NOTE — Discharge Summary (Signed)
DISCHARGE SUMMARY  Sheena Wells  MR#: [id number]  DOB:1975-11-29  Date of Admission: 11/07/2020 Date of Discharge: 11/08/2020  Attending Physician:David Rodriquez Hennie Duos, MD  Patient's QHU:[id number], [facility]  Consults: none  Disposition: D/C home    Follow-up Appts:  Follow-up Information     [facility].   Specialty: Family Medicine Contact information: [address] [place] [address] [phone]         Your Gynecologist Follow up.   Why: Schedule an appointment to be seen by your gynecologist as soon as possible so that your exceedingly heavy menstrual periods can be better managed to prevent recurrent anemia.                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  She reported not having seen her PCP in over 2 years.  She confirmed ongoing very heavy menstrual blood loss with no change from her known history.  In the ED she was hemodynamically stable but found to have a hemoglobin of 5.4.  She was transfused 1 unit PRBC and posttransfusion hemoglobin was 5.5.  Hospital Course:  Chronic blood loss anemia with acute symptomatic anemia Dysfunctional uterine bleeding as the source - lack of recent medical visits led to patient progressing to the point of becoming symptomatic from her severe anemia - transfused 3U PRBC to goal Hgb of 7.0 or > - patient confirmed to be  Hemoccult negative during ED stay   Profound iron deficiency Ferritin is 1 - iron level 17 with 3% saturation despite elevated TIBC - loaded with IV iron -resume oral iron as outpatient   Marginal B12 B12 <400 - dosed with replacement x1   Dysfunctional uterine bleeding -uterine fibroids leading to menorrhagia Outpatient GYN follow-up will be required - discussed importance of this with patient who voiced understanding    Reactive thrombocytosis Due to above   Hypokalemia Likely due to simple poor intake -supplemented  Allergies as of 11/08/2020       Reactions   Amoxicillin Other (See Comments)   Caused bad yeast infection        Medication List     STOP taking these medications    ibuprofen 200 MG tablet Commonly known as: ADVIL   multivitamin with minerals Tabs tablet   Vitamin D3 Super Strength 50 MCG (2000 UT) Tabs Generic drug: Cholecalciferol       TAKE these medications    acetaminophen 325 MG tablet Commonly known as: TYLENOL Take 2 tablets (650 mg total) by mouth every 6 (six) hours as needed for mild pain, fever or headache.   iron polysaccharides 150 MG capsule Commonly known as: NIFEREX Take 1 capsule (150 mg total) by mouth 2 (two) times daily. Start taking on: November 15, 2020 What changed: These instructions start on November 15, 2020. If you are unsure what to do until then, ask your doctor or other care provider.   polyethylene glycol powder 17 GM/SCOOP powder Commonly known as: GLYCOLAX/MIRALAX Take 17 g by mouth daily as needed for constipation.  Day of Discharge BP (!) 141/89 (BP Location: Right Arm)   Pulse 80   Temp 98.5 F (36.9 C) (Oral)   Resp 17   Ht 6' (1.829 m)   Wt 90.7 kg   LMP 10/27/2020   SpO2 99%   BMI 27.12 kg/m   Physical Exam: General: No acute respiratory distress Lungs: Clear to auscultation bilaterally without wheezes or crackles Cardiovascular: Regular rate and rhythm without murmur gallop or  rub normal S1 and S2 Abdomen: Nontender, nondistended, soft, bowel sounds positive, no rebound, no ascites, no appreciable mass Extremities: No significant cyanosis, clubbing, or edema bilateral lower extremities  Basic Metabolic Panel: Recent Labs  Lab 11/07/20 1544 11/08/20 1238  NA 137 137  K 3.2* 3.4*  CL 103 106  CO2 26 26  GLUCOSE 98 102*  BUN 8 10  CREATININE 0.78 0.86  CALCIUM 9.3 8.7*    Liver Function Tests: Recent Labs  Lab 11/07/20 1544  AST 15  ALT 8  ALKPHOS 71  BILITOT 1.1  PROT 8.5*  ALBUMIN 4.6    Coags: Recent Labs  Lab 11/07/20 1800  INR 1.0    CBC: Recent Labs  Lab 11/07/20 1544 11/07/20 2306 11/08/20 1238  WBC 10.2 8.2 7.6  NEUTROABS 7.8*  --   --   HGB 5.4* 5.5* 8.0*  HCT 21.1* 19.6* 27.2*  MCV 65.3* 67.6* 68.7*  PLT 714* 573* 526*    Time spent in discharge (includes decision making & examination of pt): 30 minutes  11/08/2020, 2:24 PM   Cherene Altes, MD [facility] Office  [phone]

## 2020-11-08 NOTE — Plan of Care (Signed)
Instructions were reviewed with patient. All questions were answered. Patient was transported to main entrance by wheelchair. ° °

## 2020-11-08 NOTE — Assessment & Plan Note (Signed)
   Patient presenting with longstanding history of dysfunctional uterine bleeding all while not regularly following up with her outpatient providers  Patient seems to have developed a several month history of progressively worsening weakness with intermittent lightheadedness all consistent with symptomatic anemia  Patient's anemia is now critical requiring packed red blood cell transfusion  1 unit has already been administered by the emergency department staff.  Hemoglobin did not increment as much as anticipated but I do not see any evidence of active bleeding on my exam nor is there any evidence of hemolysis based on normal bilirubin.  Good hemoglobin still being 5.5 we will continue with 2 more units of packed red blood cell transfusion  Will obtain posttransfusion CBC  Monitor for further evidence of bleeding  Will obtain peripheral smear to identify for any evidence of active hemolysis  Patient will need close outpatient follow-up with OB/GYN for definitive therapy  If patient develops active vaginal bleeding during this hospitalization Will consult OB/GYN

## 2020-11-08 NOTE — H&P (Addendum)
History and Physical    Sheena Wells BBC:[id number] DOB: [birth year] DOA: 11/07/2020  PCP: [facility]  Patient coming from: Home   Chief Complaint:  Chief Complaint  Patient presents with   Emesis     HPI:    45 year old female with past medical history of iron deficiency anemia, uterine fibroids with longstanding menorrhagia, hemorrhoids and hospitalization for symptomatic anemia in the past (01/2017) who presents to [facility] emergency department with complaints of lightheadedness, vomiting and weakness.   Patient explains that she has not been regularly following up with her primary care provider and has not seen them in approximately 2 years.  Patient states that she continues to experience extremely heavy periods with numerous pads being used which has been ongoing for years with no change.  Patient explains that as of late she has developed progressively worsening generalized weakness.  Initially weakness was mild in intensity progressively has become more more severe over the past several months.  This is become associate with bouts of lightheadedness as of late.  Patient explains that earlier in the day on 11/6 she stepped into the shower and experienced an extremely intense episode of lightheadedness associated with nausea and one episode of vomiting.  Patient denies any associated palpitations or chest pain during this episode.  Patient denies any loss of consciousness or head trauma.    With these progressively worsening symptoms the patient decided to present to [facility] emergency department for evaluation.  Upon evaluation in the emergency department patient was found to be hemodynamically stable initial CBC revealed hemoglobin of 5.4.  1 unit of packed red blood cells was ordered and transfused to the patient while in the emergency department which only resulted in a rise in hemoglobin to 5.5.  Because of concerns for  possible ongoing bleeding and continued need for continued blood transfusion the hospitalist group was then called to assess the patient admission the hospital.    Review of Systems:   Review of Systems  Gastrointestinal:  Positive for vomiting.  Neurological:  Positive for dizziness and weakness.  All other systems reviewed and are negative.  Past Medical History:  Diagnosis Date   Anemia    Cholecystitis 06/05/2011   H/O hiatal hernia    Hernia     Past Surgical History:  Procedure Laterality Date   BREAST SURGERY  2002   mass removed from both breasts   CHOLECYSTECTOMY  06/06/2011   Procedure: LAPAROSCOPIC CHOLECYSTECTOMY WITH INTRAOPERATIVE CHOLANGIOGRAM;  Surgeon: Adin Hector, MD;  Location: [facility];  Service: General;  Laterality: N/A;     reports that she has never smoked. She has never used smokeless tobacco. She reports current alcohol use. She reports that she does not use drugs.  Allergies  Allergen Reactions   Amoxicillin Other (See Comments)    Caused bad yeast infection    Family History  Problem Relation Age of Onset   Hypertension Mother    Diabetes Mother    Stroke Maternal Grandmother      Prior to Admission medications   Medication Sig Start Date End Date Taking? Authorizing Provider  Cholecalciferol (VITAMIN D3 SUPER STRENGTH) 50 MCG (2000 UT) TABS Take 2,000 Units by mouth once.   Yes [provider]  ibuprofen (ADVIL) 200 MG tablet Take 400 mg by mouth daily as needed (cramps/ menstrual pain).   Yes [provider]  Multiple Vitamin (MULTIVITAMIN WITH MINERALS) TABS tablet Take 2 tablets by mouth every morning.  Yes [provider]  polyethylene glycol powder (GLYCOLAX/MIRALAX) powder Take 17 g by mouth daily as needed for constipation. 01/01/17  Yes [provider]  iron polysaccharides (NIFEREX) 150 MG capsule Take 1 capsule (150 mg total) by mouth 2 (two) times daily. Patient not taking: Reported on  11/07/2020 01/03/17   Patrecia Pour, MD    Physical Exam: Vitals:   11/07/20 2301 11/08/20 0035 11/08/20 0036 11/08/20 0052  BP: 126/83  140/81 137/74  Pulse: 90  88 84  Resp: 15  16 12   Temp: 99 F (37.2 C) 99.4 F (37.4 C) 99.4 F (37.4 C) 98.9 F (37.2 C)  TempSrc: Oral Oral Oral Oral  SpO2: 98%     Weight:      Height:        Constitutional: Awake alert and oriented x3, no associated distress.   Skin: no rashes, no lesions, good skin turgor noted. Eyes: Pupils are equally reactive to light.  Increased conjunctival pallor without scleral icterus. ENMT: Moist mucous membranes noted.  Posterior pharynx clear of any exudate or lesions.   Neck: normal, supple, no masses, no thyromegaly.  No evidence of jugular venous distension.   Respiratory: clear to auscultation bilaterally, no wheezing, no crackles. Normal respiratory effort. No accessory muscle use.  Cardiovascular: Regular rate and rhythm, no murmurs / rubs / gallops. No extremity edema. 2+ pedal pulses. No carotid bruits.  Chest:   Nontender without crepitus or deformity.   Back:   Nontender without crepitus or deformity. Abdomen: Generalized mild abdominal tenderness.  Abdomen is soft however.  No evidence of intra-abdominal masses.  Positive bowel sounds noted in all quadrants.   Musculoskeletal: No joint deformity upper and lower extremities. Good ROM, no contractures. Normal muscle tone.  Neurologic: CN 2-12 grossly intact. Sensation intact.  Patient moving all 4 extremities spontaneously.  Patient is following all commands.  Patient is responsive to verbal stimuli.   Psychiatric: Patient exhibits normal mood with appropriate affect.  Patient seems to possess insight as to their current situation.     Labs on Admission: I have personally reviewed following labs and imaging studies -   CBC: Recent Labs  Lab 11/07/20 1544 11/07/20 2306  WBC 10.2 8.2  NEUTROABS 7.8*  --   HGB 5.4* 5.5*  HCT 21.1* 19.6*  MCV 65.3*  67.6*  PLT 714* 505*   Basic Metabolic Panel: Recent Labs  Lab 11/07/20 1544  NA 137  K 3.2*  CL 103  CO2 26  GLUCOSE 98  BUN 8  CREATININE 0.78  CALCIUM 9.3   GFR: Estimated Creatinine Clearance: 113.5 mL/min (by C-G formula based on SCr of 0.78 mg/dL). Liver Function Tests: Recent Labs  Lab 11/07/20 1544  AST 15  ALT 8  ALKPHOS 71  BILITOT 1.1  PROT 8.5*  ALBUMIN 4.6   No results for input(s): LIPASE, AMYLASE in the last 168 hours. No results for input(s): AMMONIA in the last 168 hours. Coagulation Profile: Recent Labs  Lab 11/07/20 1800  INR 1.0   Cardiac Enzymes: No results for input(s): CKTOTAL, CKMB, CKMBINDEX, TROPONINI in the last 168 hours. BNP (last 3 results) No results for input(s): PROBNP in the last 8760 hours. HbA1C: No results for input(s): HGBA1C in the last 72 hours. CBG: No results for input(s): GLUCAP in the last 168 hours. Lipid Profile: No results for input(s): CHOL, HDL, LDLCALC, TRIG, CHOLHDL, LDLDIRECT in the last 72 hours. Thyroid Function Tests: No results for input(s): TSH, T4TOTAL, FREET4, T3FREE, THYROIDAB  in the last 72 hours. Anemia Panel: Recent Labs    11/07/20 1719 11/07/20 1800  VITAMINB12 314  --   FOLATE  --  19.2  FERRITIN 1*  --   TIBC 574*  --   IRON 17*  --   RETICCTPCT  --  1.9   Urine analysis:    Component Value Date/Time   COLORURINE STRAW (A) 11/07/2020 1230   APPEARANCEUR CLEAR 11/07/2020 1230   LABSPEC 1.004 (L) 11/07/2020 1230   PHURINE 7.0 11/07/2020 1230   GLUCOSEU NEGATIVE 11/07/2020 1230   HGBUR NEGATIVE 11/07/2020 1230   BILIRUBINUR NEGATIVE 11/07/2020 1230   KETONESUR NEGATIVE 11/07/2020 1230   PROTEINUR NEGATIVE 11/07/2020 1230   UROBILINOGEN 0.2 06/05/2011 1008   NITRITE NEGATIVE 11/07/2020 1230   LEUKOCYTESUR SMALL (A) 11/07/2020 1230    Radiological Exams on Admission - Personally Reviewed: No results found.  EKG: Personally reviewed.  Rhythm is sinus tachycardia with heart  rate of 105 bpm.  No dynamic ST segment changes appreciated.  Assessment/Plan  * Acute on chronic blood loss anemia Patient presenting with longstanding history of dysfunctional uterine bleeding all while not regularly following up with her outpatient providers Patient seems to have developed a several month history of progressively worsening weakness with intermittent lightheadedness all consistent with symptomatic anemia Patient's anemia is now critical requiring packed red blood cell transfusion 1 unit has already been administered by the emergency department staff.  Hemoglobin did not increment as much as anticipated but I do not see any evidence of active bleeding on my exam nor is there any evidence of hemolysis based on normal bilirubin. Good hemoglobin still being 5.5 we will continue with 2 more units of packed red blood cell transfusion Will obtain posttransfusion CBC Monitor for further evidence of bleeding Will obtain peripheral smear to identify for any evidence of active hemolysis Patient will need close outpatient follow-up with OB/GYN for definitive therapy If patient develops active vaginal bleeding during this hospitalization Will consult OB/GYN  Dysfunctional uterine bleeding Longstanding history of dysfunctional uterine bleeding We will try to reestablish patient with her outpatient provider at time of discharge for definitive therapy for this issue   Reactive thrombocytosis Substantial reactive thrombocytosis likely secondary to profound and longstanding iron deficiency anemia Monitoring with serial CBCs, supportive care.  Hypokalemia Replacing with potassium chloride Evaluating for concurrent hypomagnesemia  Monitoring potassium levels with serial chemistries.      Code Status:  Full code  code status decision has been confirmed with: patient Family Communication: deferred   Status is: Observation  The patient remains OBS appropriate and will d/c before 2  midnights.       Vernelle Emerald MD [facility] Pager [phone]  If 7PM-7AM, please contact night-coverage [email or web address] Use universal [place] password for that web site. If you do not have the password, please call the hospital operator.  11/08/2020, 2:27 AM

## 2020-11-09 LAB — TYPE AND SCREEN
ABO/RH(D): O POS
Antibody Screen: NEGATIVE
Unit division: 0
Unit division: 0
Unit division: 0

## 2020-11-09 LAB — BPAM RBC
Blood Product Expiration Date: 202212052359
Blood Product Expiration Date: 202212062359
Blood Product Expiration Date: 202212062359
ISSUE DATE / TIME: 202211062040
ISSUE DATE / TIME: 202211070021
ISSUE DATE / TIME: 202211070537
Unit Type and Rh: 5100
Unit Type and Rh: 5100
Unit Type and Rh: 5100

## 2020-12-30 ENCOUNTER — Other Ambulatory Visit: Payer: Self-pay | Admitting: Obstetrics and Gynecology

## 2020-12-30 DIAGNOSIS — D259 Leiomyoma of uterus, unspecified: Secondary | ICD-10-CM

## 2023-01-29 ENCOUNTER — Encounter (HOSPITAL_BASED_OUTPATIENT_CLINIC_OR_DEPARTMENT_OTHER): Payer: Self-pay

## 2023-01-29 ENCOUNTER — Observation Stay (HOSPITAL_BASED_OUTPATIENT_CLINIC_OR_DEPARTMENT_OTHER)
Admission: EM | Admit: 2023-01-29 | Discharge: 2023-01-31 | DRG: 812 | Disposition: A | Discharge status: Home / Self Care | Payer: Self-pay | Attending: Internal Medicine | Admitting: Internal Medicine

## 2023-01-29 ENCOUNTER — Emergency Department (HOSPITAL_BASED_OUTPATIENT_CLINIC_OR_DEPARTMENT_OTHER): Payer: Self-pay

## 2023-01-29 ENCOUNTER — Other Ambulatory Visit: Payer: Self-pay

## 2023-01-29 DIAGNOSIS — N938 Other specified abnormal uterine and vaginal bleeding: Secondary | ICD-10-CM | POA: Diagnosis present

## 2023-01-29 DIAGNOSIS — D75838 Other thrombocytosis: Secondary | ICD-10-CM | POA: Diagnosis present

## 2023-01-29 DIAGNOSIS — Z1152 Encounter for screening for COVID-19: Secondary | ICD-10-CM

## 2023-01-29 DIAGNOSIS — Z833 Family history of diabetes mellitus: Secondary | ICD-10-CM

## 2023-01-29 DIAGNOSIS — Z880 Allergy status to penicillin: Secondary | ICD-10-CM

## 2023-01-29 DIAGNOSIS — Z8249 Family history of ischemic heart disease and other diseases of the circulatory system: Secondary | ICD-10-CM

## 2023-01-29 DIAGNOSIS — D500 Iron deficiency anemia secondary to blood loss (chronic): Secondary | ICD-10-CM | POA: Diagnosis present

## 2023-01-29 DIAGNOSIS — D509 Iron deficiency anemia, unspecified: Secondary | ICD-10-CM

## 2023-01-29 DIAGNOSIS — K649 Unspecified hemorrhoids: Secondary | ICD-10-CM | POA: Diagnosis present

## 2023-01-29 DIAGNOSIS — R002 Palpitations: Secondary | ICD-10-CM | POA: Diagnosis present

## 2023-01-29 DIAGNOSIS — D75839 Thrombocytosis, unspecified: Secondary | ICD-10-CM

## 2023-01-29 DIAGNOSIS — N920 Excessive and frequent menstruation with regular cycle: Secondary | ICD-10-CM | POA: Diagnosis present

## 2023-01-29 DIAGNOSIS — D649 Anemia, unspecified: Principal | ICD-10-CM | POA: Diagnosis present

## 2023-01-29 DIAGNOSIS — J101 Influenza due to other identified influenza virus with other respiratory manifestations: Secondary | ICD-10-CM | POA: Diagnosis present

## 2023-01-29 DIAGNOSIS — K625 Hemorrhage of anus and rectum: Secondary | ICD-10-CM

## 2023-01-29 DIAGNOSIS — E876 Hypokalemia: Secondary | ICD-10-CM | POA: Diagnosis present

## 2023-01-29 DIAGNOSIS — D62 Acute posthemorrhagic anemia: Principal | ICD-10-CM | POA: Diagnosis present

## 2023-01-29 DIAGNOSIS — R17 Unspecified jaundice: Secondary | ICD-10-CM | POA: Diagnosis present

## 2023-01-29 LAB — BASIC METABOLIC PANEL
Anion gap: 9 (ref 5–15)
BUN: 9 mg/dL (ref 6–20)
CO2: 27 mmol/L (ref 22–32)
Calcium: 8.5 mg/dL — ABNORMAL LOW (ref 8.9–10.3)
Chloride: 101 mmol/L (ref 98–111)
Creatinine, Ser: 1.12 mg/dL — ABNORMAL HIGH (ref 0.44–1.00)
GFR, Estimated: >60 mL/min (ref >60)
Glucose, Bld: 110 mg/dL — ABNORMAL HIGH (ref 70–99)
Potassium: 3.1 mmol/L — ABNORMAL LOW (ref 3.5–5.1)
Sodium: 137 mmol/L (ref 135–145)

## 2023-01-29 LAB — HEPATIC FUNCTION PANEL
ALT: 5 U/L (ref 0–44)
AST: 12 U/L — ABNORMAL LOW (ref 15–41)
Albumin: 4.1 g/dL (ref 3.5–5.0)
Alkaline Phosphatase: 38 U/L (ref 38–126)
Bilirubin, Direct: 0.2 mg/dL (ref 0.0–0.2)
Indirect Bilirubin: 0.7 mg/dL (ref 0.3–0.9)
Total Bilirubin: 0.9 mg/dL (ref 0.0–1.2)
Total Protein: 7.2 g/dL (ref 6.5–8.1)

## 2023-01-29 LAB — FOLATE: Folate: 24.9 ng/mL (ref >5.9)

## 2023-01-29 LAB — IRON AND TIBC
Iron: 12 ug/dL — ABNORMAL LOW (ref 28–170)
Saturation Ratios: 3 % — ABNORMAL LOW (ref 10.4–31.8)
TIBC: 455 ug/dL — ABNORMAL HIGH (ref 250–450)
UIBC: 443 ug/dL

## 2023-01-29 LAB — CBC
HCT: 15.8 % — ABNORMAL LOW (ref 36.0–46.0)
Hemoglobin: 3.9 g/dL — CL (ref 12.0–15.0)
MCH: 16.3 pg — ABNORMAL LOW (ref 26.0–34.0)
MCHC: 24.7 g/dL — ABNORMAL LOW (ref 30.0–36.0)
MCV: 66.1 fL — ABNORMAL LOW (ref 80.0–100.0)
Platelets: 608 10*3/uL — ABNORMAL HIGH (ref 150–400)
RBC: 2.39 MIL/uL — ABNORMAL LOW (ref 3.87–5.11)
RDW: 23.5 % — ABNORMAL HIGH (ref 11.5–15.5)
WBC: 4.9 10*3/uL (ref 4.0–10.5)
nRBC: 1.2 % — ABNORMAL HIGH (ref 0.0–0.2)

## 2023-01-29 LAB — PREPARE RBC (CROSSMATCH)

## 2023-01-29 LAB — MAGNESIUM: Magnesium: 1.9 mg/dL (ref 1.7–2.4)

## 2023-01-29 LAB — FERRITIN: Ferritin: 4 ng/mL — ABNORMAL LOW (ref 11–307)

## 2023-01-29 LAB — RETICULOCYTES
Immature Retic Fract: 24 % — ABNORMAL HIGH (ref 2.3–15.9)
RBC.: 2.33 MIL/uL — ABNORMAL LOW (ref 3.87–5.11)
Retic Count, Absolute: 36.1 10*3/uL (ref 19.0–186.0)
Retic Ct Pct: 1.6 % (ref 0.4–3.1)

## 2023-01-29 LAB — VITAMIN B12: Vitamin B-12: 308 pg/mL (ref 180–914)

## 2023-01-29 MED ORDER — ACETAMINOPHEN 325 MG PO TABS
650.0000 mg | ORAL_TABLET | Freq: Four times a day (QID) | ORAL | Status: DC | PRN
  Administered 2023-01-30: 650 mg via ORAL
  Filled 2023-01-29: qty 2

## 2023-01-29 MED ORDER — SODIUM CHLORIDE 0.9% IV SOLUTION: Freq: Once | INTRAVENOUS | Status: AC

## 2023-01-29 MED ORDER — ONDANSETRON HCL 4 MG/2ML IJ SOLN: 4.0000 mg | Freq: Four times a day (QID) | INTRAMUSCULAR | Status: DC | PRN

## 2023-01-29 MED ORDER — POTASSIUM CHLORIDE 10 MEQ/100ML IV SOLN
10.0000 meq | Freq: Once | INTRAVENOUS | Status: DC
Start: 2023-01-29 — End: 2023-01-29

## 2023-01-29 MED ORDER — POTASSIUM CHLORIDE CRYS ER 20 MEQ PO TBCR: 40.0000 meq | EXTENDED_RELEASE_TABLET | Freq: Once | ORAL | Status: DC

## 2023-01-29 MED ORDER — ACETAMINOPHEN 650 MG RE SUPP: 650.0000 mg | Freq: Four times a day (QID) | RECTAL | Status: DC | PRN

## 2023-01-29 MED ORDER — SODIUM CHLORIDE 0.9% IV SOLUTION
Freq: Once | INTRAVENOUS | Status: AC
  Administered 2023-01-29: 10 mL/h via INTRAVENOUS
  Filled 2023-01-29: qty 250

## 2023-01-29 MED ORDER — LACTATED RINGERS IV BOLUS: 1000.0000 mL | Freq: Once | INTRAVENOUS | Status: DC

## 2023-01-29 MED ORDER — ONDANSETRON HCL 4 MG PO TABS: 4.0000 mg | ORAL_TABLET | Freq: Four times a day (QID) | ORAL | Status: DC | PRN

## 2023-01-29 MED ORDER — PANTOPRAZOLE SODIUM 40 MG IV SOLR
80.0000 mg | Freq: Once | INTRAVENOUS | Status: AC
  Administered 2023-01-29: 80 mg via INTRAVENOUS
  Filled 2023-01-29: qty 20

## 2023-01-29 NOTE — ED Notes (Signed)
Delay in 2nd unit PRBCs, up to b/r.

## 2023-01-29 NOTE — ED Notes (Signed)
BB pink tube sent to lab

## 2023-01-29 NOTE — ED Notes (Signed)
Pt alert, NAD, calm, interactive, skin W&D, and pale. Resps e/u. Speaking clearly. Updated.

## 2023-01-29 NOTE — ED Notes (Signed)
Scant increase in temp and HR noted. Will continue to monitor. Alert, NAD, calm, interactive. 1st unit PRBC complete, sent for 2nd unit PRBCs.

## 2023-01-29 NOTE — ED Notes (Signed)
No changes, VSS, tolerating transfusion well. Will continue to monitor.

## 2023-01-29 NOTE — H&P (Signed)
History and Physical    Patient: Sheena Wells LKG:[id number] DOB: 1975-02-17 DOA: 01/29/2023 DOS: the patient was seen and examined on 01/29/2023 PCP: [facility]  Patient coming from: Home  Chief Complaint:  Chief Complaint  Patient presents with   Palpitations   HPI: Sheena Wells is a 48 y.o. female with medical history significant of chronic blood loss anemia, hyper menorrhagia, hemorrhoids with occasional bleed who presented to the emergency department with complaints of palpitations, dyspnea and fatigue after she had her last menstrual period.  She has not been taking iron supplements due to GI upset.  She has made an appointment with GYN, but unfortunately this is going to be 4 weeks from today.  She denied fever, chills, rhinorrhea, sore throat, wheezing or hemoptysis.  No chest pain, palpitations, diaphoresis, PND, orthopnea or pitting edema of the lower extremities.  No abdominal pain, nausea, emesis, diarrhea, constipation or melena.  No flank pain, dysuria, frequency or hematuria.  No polyuria, polydipsia, polyphagia or blurred vision.   Lab work: CBC showed a white count of 4.9, hemoglobin 3.9 g/dL with an MCV of 66.4 fL and platelets 608.  Ferritin level is 4 ng/mL.  Folate, B12, iron and TIBC are pending.  BMP showed a potassium of 3.1 mmol/L, glucose 110, creatinine 1.12 and calcium 8.5 mg/dL.  Normal BUN, sodium, chloride and CO2.  Imaging: Portable 1 view chest radiograph showing a subtle blunting of the left lateral costophrenic angle, which may represent trace left pleural effusion.  Normal cardiomediastinal silhouette.  ED course: Initial vital signs were temperature 97.9 F, pulse 104, respiration 14, BP 136/55 mmHg O2 sat 100% on room air.  A transfusion of 3 units of PRBC have been ordered.   Review of Systems: As mentioned in the history of present illness. All other systems reviewed and are negative.  Past Medical History:  Diagnosis  Date   Anemia    Cholecystitis 06/05/2011   H/O hiatal hernia    Hernia    Past Surgical History:  Procedure Laterality Date   BREAST SURGERY  2002   mass removed from both breasts   CHOLECYSTECTOMY  06/06/2011   Procedure: LAPAROSCOPIC CHOLECYSTECTOMY WITH INTRAOPERATIVE CHOLANGIOGRAM;  Surgeon: Ernestene Mention, MD;  Location: [facility];  Service: General;  Laterality: N/A;   Social History:  reports that she has never smoked. She has never used smokeless tobacco. She reports current alcohol use. She reports that she does not use drugs.  Allergies  Allergen Reactions   Amoxicillin Other (See Comments)    Caused bad yeast infection    Family History  Problem Relation Age of Onset   Hypertension Mother    Diabetes Mother    Stroke Maternal Grandmother     Prior to Admission medications   Medication Sig Start Date End Date Taking? Authorizing Provider  acetaminophen (TYLENOL) 325 MG tablet Take 2 tablets (650 mg total) by mouth every 6 (six) hours as needed for mild pain, fever or headache. 11/08/20   Lonia Blood, MD  iron polysaccharides (NIFEREX) 150 MG capsule Take 1 capsule (150 mg total) by mouth 2 (two) times daily. 11/15/20   Lonia Blood, MD  polyethylene glycol powder (GLYCOLAX/MIRALAX) powder Take 17 g by mouth daily as needed for constipation. 01/01/17   [provider]    Physical Exam: Vitals:   01/29/23 1010 01/29/23 1013  BP:  (!) 136/55  Pulse:  (!) 104  Resp:  14  Temp:  97.9 F (  36.6 C)  SpO2:  100%  Weight: 90.7 kg   Height: 6' (1.829 m)    Physical Exam Vitals and nursing note reviewed.  Constitutional:      General: She is awake. She is not in acute distress.    Appearance: Normal appearance. She is ill-appearing.  HENT:     Head: Normocephalic.     Nose: No rhinorrhea.     Mouth/Throat:     Mouth: Mucous membranes are moist.  Eyes:     General: No scleral icterus.    Pupils: Pupils are equal, round, and reactive to light.   Cardiovascular:     Rate and Rhythm: Normal rate and regular rhythm.     Heart sounds: Normal heart sounds.  Pulmonary:     Effort: Pulmonary effort is normal.     Breath sounds: Normal breath sounds. No wheezing, rhonchi or rales.  Abdominal:     General: Bowel sounds are normal. There is no distension.     Palpations: Abdomen is soft.     Tenderness: There is no abdominal tenderness. There is no right CVA tenderness or left CVA tenderness.  Musculoskeletal:     Cervical back: Neck supple.     Right lower leg: No edema.     Left lower leg: No edema.  Skin:    General: Skin is warm and dry.     Coloration: Skin is pale.  Neurological:     General: No focal deficit present.     Mental Status: She is alert and oriented to person, place, and time.  Psychiatric:        Mood and Affect: Mood normal.        Behavior: Behavior normal. Behavior is cooperative.     Data Reviewed:  Results are pending, will review when available. EKG: Vent. rate 103 BPM PR interval 154 ms QRS duration 92 ms QT/QTcB 348/455 ms P-R-T axes 40 45 36 Sinus tachycardia Nonspecific T wave abnormality  Assessment and Plan: Principal Problem:   Symptomatic anemia   Acute on chronic blood loss anemia   Iron deficiency anemia due to chronic blood loss Observation/PCU. Supplemental oxygen as needed. Transfuse 3 units of PRBC. Follow-up hematocrit and hemoglobin. Advised to keep GYN appointment/follow the recommendations.  Active Problems:   Dysfunctional uterine bleeding Scheduled to see GYN next month. Would benefit from oral iron supplementation.    Hypokalemia Replacing. Follow-up potassium level.    Reactive thrombocytosis Secondary to anemia. Correct anemia.    Hemorrhoids Advised to follow-up with GI as an outpatient.    Advance Care Planning:   Code Status: Full Code   Consults:   Family Communication:   Severity of Illness: The appropriate patient status for this patient  is OBSERVATION. Observation status is judged to be reasonable and necessary in order to provide the required intensity of service to ensure the patient's safety. The patient's presenting symptoms, physical exam findings, and initial radiographic and laboratory data in the context of their medical condition is felt to place them at decreased risk for further clinical deterioration. Furthermore, it is anticipated that the patient will be medically stable for discharge from the hospital within 2 midnights of admission.   Author: Bobette Mo, MD 01/29/2023 12:03 PM  For on call review [email or web address].   This document was prepared using Dragon voice recognition software and may contain some unintended transcription errors.

## 2023-01-29 NOTE — ED Notes (Signed)
Up to b/r, steady gait. Endorses some intermittent dizziness light headedness.

## 2023-01-29 NOTE — ED Notes (Signed)
PRBC transfusing continues, no rate change. Slight temp and RR increase. RR 20 to 22. Temp 99.4 to 100.1 Admitting notified. NAD, calm, interactive. No changes. Mentions chills/ feeling cold. Denies pain, HA or sob. Will monitor.

## 2023-01-29 NOTE — ED Provider Notes (Addendum)
[facility] Provider Note   CSN: [id number] Arrival date & time: 01/29/23  4098     History  Chief Complaint  Patient presents with   Palpitations    Sheena Wells is a 48 y.o. female.  Pt with feeling generalized weakness, lightheaded when stands, palpitations that are worse w standing or exertion, in past week. Indicates also w uri symptoms in past week, nasal congestion, non-prod cough, body aches (reports outpatient otc flu test positive). Hx anemia, indicates not c/w w taking iron therapy. Hx prior iron and prbc transfusion a year or two ago. Has heavy periods at baseline, hx fibroids, had period last week, no current vaginal bleeding. Recently has noticed some bright red blood in stool, and was wondering whether may have hemorrhoid. No melena. No other acute blood loss. No hx heart disease or dysrhythmia. No syncope.    The history is provided by the patient and medical records.  Palpitations Associated symptoms: cough   Associated symptoms: no back pain, no chest pain, no nausea, no shortness of breath and no vomiting        Home Medications Prior to Admission medications   Medication Sig Start Date End Date Taking? Authorizing Provider  acetaminophen (TYLENOL) 325 MG tablet Take 2 tablets (650 mg total) by mouth every 6 (six) hours as needed for mild pain, fever or headache. 11/08/20   Lonia Blood, MD  iron polysaccharides (NIFEREX) 150 MG capsule Take 1 capsule (150 mg total) by mouth 2 (two) times daily. 11/15/20   Lonia Blood, MD  polyethylene glycol powder (GLYCOLAX/MIRALAX) powder Take 17 g by mouth daily as needed for constipation. 01/01/17   [provider]      Allergies    Amoxicillin    Review of Systems   Review of Systems  Constitutional:  Negative for chills and fever.  HENT:  Positive for congestion and rhinorrhea. Negative for sore throat.   Eyes:  Negative for redness.  Respiratory:   Positive for cough. Negative for shortness of breath.   Cardiovascular:  Positive for palpitations. Negative for chest pain and leg swelling.  Gastrointestinal:  Negative for abdominal pain, diarrhea, nausea and vomiting.  Genitourinary:  Negative for dysuria, flank pain and hematuria.  Musculoskeletal:  Negative for back pain and neck pain.  Skin:  Negative for rash.  Neurological:  Positive for light-headedness. Negative for headaches.  Hematological:  Does not bruise/bleed easily.  Psychiatric/Behavioral:  Negative for confusion.     Physical Exam Updated Vital Signs BP (!) 136/55 (BP Location: Left Arm)   Pulse (!) 104   Temp 97.9 F (36.6 C)   Resp 14   Ht 1.829 m (6')   Wt 90.7 kg   SpO2 100%   BMI 27.12 kg/m  Physical Exam Vitals and nursing note reviewed.  Constitutional:      Appearance: Normal appearance. She is well-developed.  HENT:     Head: Atraumatic.     Nose: Nose normal.     Mouth/Throat:     Mouth: Mucous membranes are moist.  Eyes:     General: No scleral icterus.    Pupils: Pupils are equal, round, and reactive to light.     Comments: Conj pale.   Neck:     Trachea: No tracheal deviation.     Comments: Trachea midline. Thyroid not grossly enlarged or tender.  Cardiovascular:     Rate and Rhythm: Regular rhythm. Tachycardia present.     Pulses: Normal  pulses.     Heart sounds: Normal heart sounds. No murmur heard.    No friction rub. No gallop.  Pulmonary:     Effort: Pulmonary effort is normal. No respiratory distress.     Breath sounds: Normal breath sounds.  Abdominal:     General: Bowel sounds are normal. There is no distension.     Palpations: Abdomen is soft. There is no mass.     Tenderness: There is no abdominal tenderness. There is no guarding.  Genitourinary:    Comments: No cva tenderness. Chaperoned exam - scant stool/brown, no gross blood, sent for hemoccult.  Musculoskeletal:        General: No swelling or tenderness.      Cervical back: Normal range of motion and neck supple. No rigidity. No muscular tenderness.     Right lower leg: No edema.     Left lower leg: No edema.  Skin:    General: Skin is warm and dry.     Coloration: Skin is pale.     Findings: No rash.  Neurological:     Mental Status: She is alert.     Comments: Alert, speech normal.   Psychiatric:        Mood and Affect: Mood normal.     ED Results / Procedures / Treatments   Labs (all labs ordered are listed, but only abnormal results are displayed) Results for orders placed or performed during the hospital encounter of 01/29/23  Basic metabolic panel   Collection Time: 01/29/23 10:19 AM  Result Value Ref Range   Sodium 137 135 - 145 mmol/L   Potassium 3.1 (L) 3.5 - 5.1 mmol/L   Chloride 101 98 - 111 mmol/L   CO2 27 22 - 32 mmol/L   Glucose, Bld 110 (H) 70 - 99 mg/dL   BUN 9 6 - 20 mg/dL   Creatinine, Ser 5.62 (H) 0.44 - 1.00 mg/dL   Calcium 8.5 (L) 8.9 - 10.3 mg/dL   GFR, Estimated >13 >08 mL/min   Anion gap 9 5 - 15  CBC   Collection Time: 01/29/23 10:19 AM  Result Value Ref Range   WBC 4.9 4.0 - 10.5 K/uL   RBC 2.39 (L) 3.87 - 5.11 MIL/uL   Hemoglobin 3.9 (LL) 12.0 - 15.0 g/dL   HCT 65.7 (L) 84.6 - 96.2 %   MCV 66.1 (L) 80.0 - 100.0 fL   MCH 16.3 (L) 26.0 - 34.0 pg   MCHC 24.7 (L) 30.0 - 36.0 g/dL   RDW 95.2 (H) 84.1 - 32.4 %   Platelets 608 (H) 150 - 400 K/uL   nRBC 1.2 (H) 0.0 - 0.2 %     EKG EKG Interpretation Date/Time:  Monday January 29 2023 10:10:41 EST Ventricular Rate:  103 PR Interval:  154 QRS Duration:  92 QT Interval:  348 QTC Calculation: 455 R Axis:   45  Text Interpretation: Sinus tachycardia Nonspecific T wave abnormality Confirmed by Cathren Laine ([id number]) on 01/29/2023 10:17:06 AM  Radiology DG Chest Port 1 View Result Date: 01/29/2023 CLINICAL DATA:  5107 Atrial fibrillation (HCC) 5107.  Palpitations. EXAM: PORTABLE CHEST 1 VIEW COMPARISON:  None Available. FINDINGS: Bilateral lung  fields are clear. There is subtle blunting of left lateral costophrenic angle, which may represent trace left pleural effusion. Right lateral costophrenic angle is clear. Normal cardio-mediastinal silhouette. No acute osseous abnormalities. The soft tissues are within normal limits. IMPRESSION: *Subtle blunting of left lateral costophrenic angle may represent trace left pleural effusion. *Otherwise  no acute cardiopulmonary abnormality. Electronically Signed   By: Jules Schick M.D.   On: 01/29/2023 10:53    Procedures Procedures    Medications Ordered in ED Medications  lactated ringers bolus 1,000 mL (has no administration in time range)  potassium chloride SA (KLOR-CON M) CR tablet 40 mEq (has no administration in time range)  potassium chloride 10 mEq in 100 mL IVPB (has no administration in time range)  0.9 %  sodium chloride infusion (Manually program via Guardrails IV Fluids) (10 mL/hr Intravenous New Bag/Given 01/29/23 1107)  pantoprazole (PROTONIX) injection 80 mg (80 mg Intravenous Given 01/29/23 1106)    ED Course/ Medical Decision Making/ A&P                                 Medical Decision Making Problems Addressed: Hypokalemia: acute illness or injury Iron deficiency anemia, unspecified iron deficiency anemia type: chronic illness or injury with exacerbation, progression, or side effects of treatment that poses a threat to life or bodily functions Menorrhagia with regular cycle: chronic illness or injury with exacerbation, progression, or side effects of treatment that poses a threat to life or bodily functions Rectal bleeding: acute illness or injury Severe anemia: acute illness or injury with systemic symptoms that poses a threat to life or bodily functions Symptomatic anemia: acute illness or injury with systemic symptoms that poses a threat to life or bodily functions Thrombocytosis: chronic illness or injury  Amount and/or Complexity of Data Reviewed Independent  Historian:     Details: Fam, hx External Data Reviewed: labs and notes. Labs: ordered. Decision-making details documented in ED Course. Radiology: ordered and independent interpretation performed. Decision-making details documented in ED Course. ECG/medicine tests: ordered and independent interpretation performed. Decision-making details documented in ED Course. Discussion of management or test interpretation with external provider(s): ED, IM/hospitalists  Risk Prescription drug management. Decision regarding hospitalization.   Iv ns. Continuous pulse ox and cardiac monitoring. Labs ordered/sent. Imaging ordered.   Differential diagnosis includes symptomatic anemia, heavy periods, rectal bleeding, dysrhythmia, etc. Dispo decision including potential need for admission considered - will get labs and imaging and reassess.   Reviewed nursing notes and prior charts for additional history. External reports reviewed. Additional history from: family/friend.   Cardiac monitor: sinus rhythm, rate 104.   LR bolus. Protonix iv.   Labs reviewed/interpreted by me - hgb very low, 3.9, will transfuse the one unit prbc we have here and facilitate transfer to main hospital. K low. Kcl iv and po.   Discussed w EDP at [facility], Dr Adela Lank, who accepts in transfer, for emergent transfusion and admission.   Hospitalist consulted for admission. Discussed pt with Dr Robb Matar, will admit.   Xrays reviewed/interpreted by me - no pna.   CRITICAL CARE severe symptomatic anemia, hgb 3.9, requiring emergent prbc transfusion Performed by: Suzi Roots Total critical care time: 40 minutes Critical care time was exclusive of separately billable procedures and treating other patients. Critical care was necessary to treat or prevent imminent or life-threatening deterioration. Critical care was time spent personally by me on the following activities: development of treatment plan with patient and/or surrogate as well as  nursing, discussions with consultants, evaluation of patient's response to treatment, examination of patient, obtaining history from patient or surrogate, ordering and performing treatments and interventions, ordering and review of laboratory studies, ordering and review of radiographic studies, pulse oximetry and re-evaluation of patient's condition.  Final Clinical Impression(s) / ED Diagnoses Final diagnoses:  Symptomatic anemia  Severe anemia  Menorrhagia with regular cycle  Rectal bleeding  Hypokalemia  Thrombocytosis  Iron deficiency anemia, unspecified iron deficiency anemia type    Rx / DC Orders ED Discharge Orders     None         Cathren Laine, MD 01/29/23 1152

## 2023-01-29 NOTE — ED Triage Notes (Signed)
States been having heart palpations onset this weekend.  States getting over the flu from Thursday.  Since feeling generalized weakness and short of breath. Denies chest pain.  States when palpations happen feels SOB; dizziness and and weak.

## 2023-01-29 NOTE — ED Notes (Signed)
Called [place] [facility] for transport to [facility] ED-Dr. Adela Lank accepting [phone]

## 2023-01-30 LAB — RESPIRATORY PANEL BY PCR

## 2023-01-30 LAB — COMPREHENSIVE METABOLIC PANEL
ALT: 8 U/L (ref 0–44)
AST: 14 U/L — ABNORMAL LOW (ref 15–41)
Albumin: 3.4 g/dL — ABNORMAL LOW (ref 3.5–5.0)
Alkaline Phosphatase: 45 U/L (ref 38–126)
Anion gap: 9 (ref 5–15)
BUN: 9 mg/dL (ref 6–20)
CO2: 24 mmol/L (ref 22–32)
Calcium: 8.3 mg/dL — ABNORMAL LOW (ref 8.9–10.3)
Chloride: 102 mmol/L (ref 98–111)
Creatinine, Ser: 0.81 mg/dL (ref 0.44–1.00)
GFR, Estimated: >60 mL/min (ref >60)
Glucose, Bld: 104 mg/dL — ABNORMAL HIGH (ref 70–99)
Potassium: 3 mmol/L — ABNORMAL LOW (ref 3.5–5.1)
Sodium: 135 mmol/L (ref 135–145)
Total Bilirubin: 2 mg/dL — ABNORMAL HIGH (ref 0.0–1.2)
Total Protein: 7.2 g/dL (ref 6.5–8.1)

## 2023-01-30 LAB — CBC
HCT: 26.8 % — ABNORMAL LOW (ref 36.0–46.0)
Hemoglobin: 8 g/dL — ABNORMAL LOW (ref 12.0–15.0)
MCH: 22.7 pg — ABNORMAL LOW (ref 26.0–34.0)
MCHC: 29.9 g/dL — ABNORMAL LOW (ref 30.0–36.0)
MCV: 75.9 fL — ABNORMAL LOW (ref 80.0–100.0)
Platelets: 465 10*3/uL — ABNORMAL HIGH (ref 150–400)
RBC: 3.53 MIL/uL — ABNORMAL LOW (ref 3.87–5.11)
RDW: 25.3 % — ABNORMAL HIGH (ref 11.5–15.5)
WBC: 5.2 10*3/uL (ref 4.0–10.5)
nRBC: 1.1 % — ABNORMAL HIGH (ref 0.0–0.2)

## 2023-01-30 LAB — TSH: TSH: 7.206 u[IU]/mL — ABNORMAL HIGH (ref 0.350–4.500)

## 2023-01-30 LAB — HEMOGLOBIN AND HEMATOCRIT, BLOOD
HCT: 23.4 % — ABNORMAL LOW (ref 36.0–46.0)
Hemoglobin: 6.7 g/dL — CL (ref 12.0–15.0)

## 2023-01-30 LAB — SARS CORONAVIRUS 2 BY RT PCR: SARS Coronavirus 2 by RT PCR: NEGATIVE

## 2023-01-30 LAB — HIV ANTIBODY (ROUTINE TESTING W REFLEX): HIV Screen 4th Generation wRfx: NONREACTIVE

## 2023-01-30 LAB — PREPARE RBC (CROSSMATCH)

## 2023-01-30 MED ORDER — METHOCARBAMOL 500 MG PO TABS
500.0000 mg | ORAL_TABLET | Freq: Four times a day (QID) | ORAL | Status: DC | PRN
  Administered 2023-01-30: 500 mg via ORAL
  Filled 2023-01-30 (×2): qty 1

## 2023-01-30 MED ORDER — OSELTAMIVIR PHOSPHATE 75 MG PO CAPS
75.0000 mg | ORAL_CAPSULE | Freq: Two times a day (BID) | ORAL | Status: DC
  Administered 2023-01-30 – 2023-01-31 (×2): 75 mg via ORAL
  Filled 2023-01-30 (×2): qty 1

## 2023-01-30 MED ORDER — POTASSIUM CHLORIDE CRYS ER 20 MEQ PO TBCR
40.0000 meq | EXTENDED_RELEASE_TABLET | Freq: Once | ORAL | Status: AC
  Administered 2023-01-30: 40 meq via ORAL
  Filled 2023-01-30: qty 2

## 2023-01-30 MED ORDER — SODIUM CHLORIDE 0.9 % IV SOLN
100.0000 mg | Freq: Once | INTRAVENOUS | Status: AC
  Administered 2023-01-30: 100 mg via INTRAVENOUS
  Filled 2023-01-30: qty 5

## 2023-01-30 MED ORDER — SODIUM CHLORIDE 0.9% IV SOLUTION: Freq: Once | INTRAVENOUS | Status: AC

## 2023-01-30 NOTE — ED Notes (Signed)
Tried to call report. Was told RN will call back. Care link also called

## 2023-01-30 NOTE — ED Notes (Signed)
Pt resting in room. VS WDL. Family in the room with pt.

## 2023-01-30 NOTE — Progress Notes (Addendum)
PROGRESS NOTE    Sheena Wells  ZHY:[id number] DOB: 03-20-1975 DOA: 01/29/2023 PCP: [facility]   Brief Narrative: 48 year old with past medical history significant for chronic blood loss anemia, hyper menorrhagia, hemorrhoids with occasional bleed presents to the ED complaining of dyspnea, palpitation, fatigue after she had her last menstrual period. She has not been  taking iron supplements due to GI upset.  Presented with severe anemia with a hemoglobin of 3.9.  Chest x-ray showed subtle blunting of the left lateral costophrenic angle.  May represent trace left pleural effusion.  3 units of packed red blood cell  was  ordered.  Assessment & Plan:   Principal Problem:   Symptomatic anemia Active Problems:   Dysfunctional uterine bleeding   Hypokalemia   Iron deficiency anemia due to chronic blood loss   Reactive thrombocytosis   Hemorrhoids   Acute on chronic blood loss anemia   1-Symptomatic anemia Acute blood loss anemia Iron deficiency: -received 3 units PRBC>  -Plan to check CBC this afternoon and tomorrow morning.  -Will give IV iron.  -Needs follow up with GYN  Dysfunctional uterine bleed. -Check TSH. Elevated. Check Free T 3,  T4.  -Needs follow up with GYN  Hypokalemia Replete orally.   Reactive thrombocytosis In setting anemia Improving.   Hemorrhoids.  Denies active bleeding.   Hyperbilirubinemia; repeat labs tomorrow.  Influenza A positive. Start Tamiflu   Estimated body mass index is 27.12 kg/m as calculated from the following:   Height as of this encounter: 6' (1.829 m).   Weight as of this encounter: 90.7 kg.   DVT prophylaxis: SCD Code Status: Full code Family Communication:Care discussed with patient Disposition Plan:  Status is: Observation The patient remains OBS appropriate and will d/c before 2 midnights.    Consultants:  None  Procedures:  None  Antimicrobials:    Subjective: She is  feeling better. , she is not  bleeding. Last Menstrual period was 1 week ago.    Objective: Vitals:   01/30/23 0104 01/30/23 0230 01/30/23 0300 01/30/23 0500  BP: 137/79 (!) 145/82 129/80 132/80  Pulse: 86 80 73 77  Resp: (!) 21 17 15 16   Temp: 98.6 F (37 C) 98.4 F (36.9 C) 98.5 F (36.9 C) 98.3 F (36.8 C)  TempSrc: Oral Oral Oral Oral  SpO2: 99% 100% 99% 96%  Weight:      Height:        Intake/Output Summary (Last 24 hours) at 01/30/2023 0805 Last data filed at 01/30/2023 0510 Gross per 24 hour  Intake 1113.67 ml  Output --  Net 1113.67 ml   Filed Weights   01/29/23 1010  Weight: 90.7 kg    Examination:  General exam: Appears calm and comfortable  Respiratory system: Clear to auscultation. Respiratory effort normal. Cardiovascular system: S1 & S2 heard, RRR.  Gastrointestinal system: Abdomen is nondistended, soft and nontender. No organomegaly or masses felt. Normal bowel sounds heard. Central nervous system: Alert and oriented.  Extremities: Symmetric 5 x 5 power.     Data Reviewed: I have personally reviewed following labs and imaging studies  CBC: Recent Labs  Lab 01/29/23 1019 01/30/23 0019 01/30/23 0610  WBC 4.9  --  5.2  HGB 3.9* 6.7* 8.0*  HCT 15.8* 23.4* 26.8*  MCV 66.1*  --  75.9*  PLT 608*  --  465*   Basic Metabolic Panel: Recent Labs  Lab 01/29/23 1019 01/29/23 1100 01/30/23 0610  NA 137  --  135  K 3.1*  --  3.0*  CL 101  --  102  CO2 27  --  24  GLUCOSE 110*  --  104*  BUN 9  --  9  CREATININE 1.12*  --  0.81  CALCIUM 8.5*  --  8.3*  MG  --  1.9  --    GFR: Estimated Creatinine Clearance: 109.7 mL/min (by C-G formula based on SCr of 0.81 mg/dL). Liver Function Tests: Recent Labs  Lab 01/29/23 1100 01/30/23 0610  AST 12* 14*  ALT 5 8  ALKPHOS 38 45  BILITOT 0.9 2.0*  PROT 7.2 7.2  ALBUMIN 4.1 3.4*   No results for input(s): "LIPASE", "AMYLASE" in the last 168 hours. No results for input(s): "AMMONIA" in the last  168 hours. Coagulation Profile: No results for input(s): "INR", "PROTIME" in the last 168 hours. Cardiac Enzymes: No results for input(s): "CKTOTAL", "CKMB", "CKMBINDEX", "TROPONINI" in the last 168 hours. BNP (last 3 results) No results for input(s): "PROBNP" in the last 8760 hours. HbA1C: No results for input(s): "HGBA1C" in the last 72 hours. CBG: No results for input(s): "GLUCAP" in the last 168 hours. Lipid Profile: No results for input(s): "CHOL", "HDL", "LDLCALC", "TRIG", "CHOLHDL", "LDLDIRECT" in the last 72 hours. Thyroid Function Tests: No results for input(s): "TSH", "T4TOTAL", "FREET4", "T3FREE", "THYROIDAB" in the last 72 hours. Anemia Panel: Recent Labs    01/29/23 1100  VITAMINB12 308  FOLATE 24.9  FERRITIN 4*  TIBC 455*  IRON 12*  RETICCTPCT 1.6   Sepsis Labs: No results for input(s): "PROCALCITON", "LATICACIDVEN" in the last 168 hours.  No results found for this or any previous visit (from the past 240 hours).       Radiology Studies: DG Chest Port 1 View Result Date: 01/29/2023 CLINICAL DATA:  5107 Atrial fibrillation (HCC) 5107.  Palpitations. EXAM: PORTABLE CHEST 1 VIEW COMPARISON:  None Available. FINDINGS: Bilateral lung fields are clear. There is subtle blunting of left lateral costophrenic angle, which may represent trace left pleural effusion. Right lateral costophrenic angle is clear. Normal cardio-mediastinal silhouette. No acute osseous abnormalities. The soft tissues are within normal limits. IMPRESSION: *Subtle blunting of left lateral costophrenic angle may represent trace left pleural effusion. *Otherwise no acute cardiopulmonary abnormality. Electronically Signed   By: Jules Schick M.D.   On: 01/29/2023 10:53        Scheduled Meds:  potassium chloride  40 mEq Oral Once   Continuous Infusions:  lactated ringers       LOS: 0 days    Time spent: 35 minutes    Hiroyuki Ozanich A Pandora Mccrackin, MD [facility]   If 7PM-7AM, please  contact night-coverage [email or web address]  01/30/2023, 8:05 AM

## 2023-01-31 LAB — COMPREHENSIVE METABOLIC PANEL
ALT: 7 U/L (ref 0–44)
AST: 14 U/L — ABNORMAL LOW (ref 15–41)
Albumin: 3.2 g/dL — ABNORMAL LOW (ref 3.5–5.0)
Alkaline Phosphatase: 46 U/L (ref 38–126)
Anion gap: 10 (ref 5–15)
BUN: 9 mg/dL (ref 6–20)
CO2: 21 mmol/L — ABNORMAL LOW (ref 22–32)
Calcium: 8.3 mg/dL — ABNORMAL LOW (ref 8.9–10.3)
Chloride: 105 mmol/L (ref 98–111)
Creatinine, Ser: 1.15 mg/dL — ABNORMAL HIGH (ref 0.44–1.00)
GFR, Estimated: 59 mL/min — ABNORMAL LOW (ref >60)
Glucose, Bld: 101 mg/dL — ABNORMAL HIGH (ref 70–99)
Potassium: 3.3 mmol/L — ABNORMAL LOW (ref 3.5–5.1)
Sodium: 136 mmol/L (ref 135–145)
Total Bilirubin: 1.2 mg/dL (ref 0.0–1.2)
Total Protein: 6.9 g/dL (ref 6.5–8.1)

## 2023-01-31 LAB — TYPE AND SCREEN
ABO/RH(D): O POS
ABO/RH(D): O POS
Antibody Screen: NEGATIVE
Antibody Screen: NEGATIVE
Unit division: 0
Unit division: 0
Unit division: 0
Unit division: 0

## 2023-01-31 LAB — BPAM RBC
Blood Product Expiration Date: 202502152359
Blood Product Expiration Date: 202502242359
Blood Product Expiration Date: 202502262359
Blood Product Expiration Date: 202502262359
ISSUE DATE / TIME: 202501280238
ISSUE DATE / TIME: 202501271414
ISSUE DATE / TIME: 202501271724
ISSUE DATE / TIME: 202501272019
Unit Type and Rh: 5100
Unit Type and Rh: 5100
Unit Type and Rh: 5100
Unit Type and Rh: 5100

## 2023-01-31 LAB — CBC
HCT: 27 % — ABNORMAL LOW (ref 36.0–46.0)
Hemoglobin: 8.1 g/dL — ABNORMAL LOW (ref 12.0–15.0)
MCH: 22.9 pg — ABNORMAL LOW (ref 26.0–34.0)
MCHC: 30 g/dL (ref 30.0–36.0)
MCV: 76.5 fL — ABNORMAL LOW (ref 80.0–100.0)
Platelets: 445 10*3/uL — ABNORMAL HIGH (ref 150–400)
RBC: 3.53 MIL/uL — ABNORMAL LOW (ref 3.87–5.11)
RDW: 26.5 % — ABNORMAL HIGH (ref 11.5–15.5)
WBC: 6.1 10*3/uL (ref 4.0–10.5)
nRBC: 1.5 % — ABNORMAL HIGH (ref 0.0–0.2)

## 2023-01-31 LAB — T4, FREE: Free T4: 0.88 ng/dL (ref 0.61–1.12)

## 2023-01-31 MED ORDER — OSELTAMIVIR PHOSPHATE 75 MG PO CAPS: 75.0000 mg | ORAL_CAPSULE | Freq: Two times a day (BID) | ORAL | 0 refills | Status: AC

## 2023-01-31 MED ORDER — METHOCARBAMOL 500 MG PO TABS: 500.0000 mg | ORAL_TABLET | Freq: Four times a day (QID) | ORAL | 0 refills | Status: AC | PRN

## 2023-01-31 MED ORDER — POTASSIUM CHLORIDE CRYS ER 20 MEQ PO TBCR
40.0000 meq | EXTENDED_RELEASE_TABLET | Freq: Once | ORAL | Status: AC
Start: 2023-01-31 — End: 2023-01-31
  Administered 2023-01-31: 40 meq via ORAL
  Filled 2023-01-31: qty 2

## 2023-01-31 NOTE — Discharge Summary (Signed)
Physician Discharge Summary  Sheena Wells ZOX:[id number] DOB: 10/22/1975 DOA: 01/29/2023  PCP: [facility]  Admit date: 01/29/2023 Discharge date: 01/31/2023 Recommendations for Outpatient Follow-up:  Follow up with PCP in 1 weeks-call for appointment Fu with GYN Dr Sallyanne Havers as scheduled in feb Please obtain BMP/CBC in one week  Discharge Dispo: Home Discharge Condition: Stable Code Status:   Code Status: Full Code Diet recommendation:  Diet Order             Diet regular Room service appropriate? Yes; Fluid consistency: Thin  Diet effective now                    Brief/Interim Summary: 48 year old with past medical history significant for chronic blood loss anemia, hyper menorrhagia, hemorrhoids with occasional bleed presents to the ED complaining of dyspnea, palpitation, fatigue after she had her last menstrual period. She has not been  taking iron supplements due to GI upset. She presented with severe anemia with a hemoglobin of 3.9.  Chest x-ray showed subtle blunting of the left lateral costophrenic angle.  May represent trace left pleural effusion. 3 units of packed red blood cell  was  ordered and admitted for further management. Patient overall hemodynamically stable afebrile.  Hemoglobin appropriately increased to 8 g and holding stable, received IV iron She will need close follow-up with PCP, GYN and continue her iron supplementation    Discharge Diagnoses:  Principal Problem:   Symptomatic anemia Active Problems:   Dysfunctional uterine bleeding   Hypokalemia   Iron deficiency anemia due to chronic blood loss   Reactive thrombocytosis   Hemorrhoids   Acute on chronic blood loss anemia   Symptomatic anemia Acute blood loss anemia due to DU B Iron deficiency: Dysfunctional uterine bleed. S/p 3 units PRBC hemoglobin appropriately increased.  S/P IV iron, continue iron supplements at home, follow-up with GYN, check CBC in 1 week has  fu with OBGYN Euri in feb. No more vaginal bleeding as she is done with her periods TSH.-Elevated.  Free T4 normal, follow-up with PCP.  She will need to follow-up closely with her GYN   Hypokalemia Repleted   Reactive thrombocytosis In setting anemia, Improving.    Hemorrhoids.  Denies active bleeding.    Hyperbilirubinemia: Resolved  Influenza A positive: Started Tamiflu 1/28-doing well continue to complete the course   Consults: NONE Subjective: Alert awake oriented no shortness of breath or fever.  no bleeding, eager to go home today  Discharge Exam: Vitals:   01/31/23 0426 01/31/23 0730  BP: 125/76 126/74  Pulse: 68 69  Resp: 18 16  Temp: 98.6 F (37 C) 97.8 F (36.6 C)  SpO2: 96% 97%   General: Pt is alert, awake, not in acute distress Cardiovascular: RRR, S1/S2 +, no rubs, no gallops Respiratory: CTA bilaterally, no wheezing, no rhonchi Abdominal: Soft, NT, ND, bowel sounds + Extremities: no edema, no cyanosis  Discharge Instructions  Discharge Instructions     Discharge instructions   Complete by: As directed    Please call call MD or return to ER for similar or worsening recurring problem that brought you to hospital or if any fever,nausea/vomiting,abdominal pain, uncontrolled pain, chest pain,  shortness of breath or any other alarming symptoms.  Please follow-up your doctor as instructed in a week time and call the office for appointment.  Please avoid alcohol, smoking, or any other illicit substance and maintain healthy habits including taking your regular medications as prescribed.  You were  cared for by a hospitalist during your hospital stay. If you have any questions about your discharge medications or the care you received while you were in the hospital after you are discharged, you can call the unit and ask to speak with the hospitalist on call if the hospitalist that took care of you is not available.  Once you are discharged, your primary  care physician will handle any further medical issues. Please note that NO REFILLS for any discharge medications will be authorized once you are discharged, as it is imperative that you return to your primary care physician (or establish a relationship with a primary care physician if you do not have one) for your aftercare needs so that they can reassess your need for medications and monitor your lab values   Increase activity slowly   Complete by: As directed       Allergies as of 01/31/2023       Reactions   Amoxicillin Other (See Comments)   Caused bad yeast infection        Medication List     TAKE these medications    ferrous sulfate 325 (65 FE) MG tablet Take 325 mg by mouth daily.   methocarbamol 500 MG tablet Commonly known as: ROBAXIN Take 1 tablet (500 mg total) by mouth every 6 (six) hours as needed for muscle spasms.   oseltamivir 75 MG capsule Commonly known as: TAMIFLU Take 1 capsule (75 mg total) by mouth 2 (two) times daily for 4 days.   Vitamin D (Ergocalciferol) 1.25 MG (50000 UNIT) Caps capsule Commonly known as: DRISDOL Take 50,000 Units by mouth once a week.        Allergies  Allergen Reactions   Amoxicillin Other (See Comments)    Caused bad yeast infection    The results of significant diagnostics from this hospitalization (including imaging, microbiology, ancillary and laboratory) are listed below for reference.    Microbiology: Recent Results (from the past 240 hours)  Respiratory (~20 pathogens) panel by PCR     Status: Abnormal   Collection Time: 01/30/23 11:10 AM   Specimen: Anterior Nasal Swab; Respiratory  Result Value Ref Range Status   Adenovirus NOT DETECTED NOT DETECTED Final   Coronavirus 229E NOT DETECTED NOT DETECTED Final    Comment: (NOTE) The Coronavirus on the Respiratory Panel, DOES NOT test for the novel  Coronavirus (2019 nCoV)    Coronavirus HKU1 NOT DETECTED NOT DETECTED Final   Coronavirus NL63 NOT DETECTED NOT  DETECTED Final   Coronavirus OC43 NOT DETECTED NOT DETECTED Final   Metapneumovirus NOT DETECTED NOT DETECTED Final   Rhinovirus / Enterovirus NOT DETECTED NOT DETECTED Final   Influenza A H3 DETECTED (A) NOT DETECTED Final   Influenza B NOT DETECTED NOT DETECTED Final   Parainfluenza Virus 1 NOT DETECTED NOT DETECTED Final   Parainfluenza Virus 2 NOT DETECTED NOT DETECTED Final   Parainfluenza Virus 3 NOT DETECTED NOT DETECTED Final   Parainfluenza Virus 4 NOT DETECTED NOT DETECTED Final   Respiratory Syncytial Virus NOT DETECTED NOT DETECTED Final   Bordetella pertussis NOT DETECTED NOT DETECTED Final   Bordetella Parapertussis NOT DETECTED NOT DETECTED Final   Chlamydophila pneumoniae NOT DETECTED NOT DETECTED Final   Mycoplasma pneumoniae NOT DETECTED NOT DETECTED Final    Comment: Performed at [facility], [address] [place], [place] [address]  SARS Coronavirus 2 by RT PCR (hospital order, performed in [facility] hospital lab) *cepheid single result test* Anterior Nasal  Swab     Status: None   Collection Time: 01/30/23 11:15 AM   Specimen: Anterior Nasal Swab  Result Value Ref Range Status   SARS Coronavirus 2 by RT PCR NEGATIVE NEGATIVE Final    Comment: (NOTE) SARS-CoV-2 target nucleic acids are NOT DETECTED.  The SARS-CoV-2 RNA is generally detectable in upper and lower respiratory specimens during the acute phase of infection. The lowest concentration of SARS-CoV-2 viral copies this assay can detect is 250 copies / mL. A negative result does not preclude SARS-CoV-2 infection and should not be used as the sole basis for treatment or other patient management decisions.  A negative result may occur with improper specimen collection / handling, submission of specimen other than nasopharyngeal swab, presence of viral mutation(s) within the areas targeted by this assay, and inadequate number of viral copies (<250 copies / mL). A negative result must be combined with  clinical observations, patient history, and epidemiological information.  Fact Sheet for Patients:   RoadLapTop.co.za  Fact Sheet for Healthcare Providers: http://kim-miller.com/  This test is not yet approved or  cleared by the [place] FDA and has been authorized for detection and/or diagnosis of SARS-CoV-2 by FDA under an Emergency Use Authorization (EUA).  This EUA will remain in effect (meaning this test can be used) for the duration of the COVID-19 declaration under Section 564(b)(1) of the Act, 21 U.S.C. section 360bbb-3(b)(1), unless the authorization is terminated or revoked sooner.  Performed at [facility], [address], [place], [place] [address]     Procedures/Studies: DG Chest Port 1 View Result Date: 01/29/2023 CLINICAL DATA:  5107 Atrial fibrillation (HCC) 5107.  Palpitations. EXAM: PORTABLE CHEST 1 VIEW COMPARISON:  None Available. FINDINGS: Bilateral lung fields are clear. There is subtle blunting of left lateral costophrenic angle, which may represent trace left pleural effusion. Right lateral costophrenic angle is clear. Normal cardio-mediastinal silhouette. No acute osseous abnormalities. The soft tissues are within normal limits. IMPRESSION: *Subtle blunting of left lateral costophrenic angle may represent trace left pleural effusion. *Otherwise no acute cardiopulmonary abnormality. Electronically Signed   By: Jules Schick M.D.   On: 01/29/2023 10:53    Labs: BNP (last 3 results) No results for input(s): "BNP" in the last 8760 hours. Basic Metabolic Panel: Recent Labs  Lab 01/29/23 1019 01/29/23 1100 01/30/23 0610 01/31/23 0554  NA 137  --  135 136  K 3.1*  --  3.0* 3.3*  CL 101  --  102 105  CO2 27  --  24 21*  GLUCOSE 110*  --  104* 101*  BUN 9  --  9 9  CREATININE 1.12*  --  0.81 1.15*  CALCIUM 8.5*  --  8.3* 8.3*  MG  --  1.9  --   --    Liver Function Tests: Recent Labs   Lab 01/29/23 1100 01/30/23 0610 01/31/23 0554  AST 12* 14* 14*  ALT 5 8 7   ALKPHOS 38 45 46  BILITOT 0.9 2.0* 1.2  PROT 7.2 7.2 6.9  ALBUMIN 4.1 3.4* 3.2*   No results for input(s): "LIPASE", "AMYLASE" in the last 168 hours. No results for input(s): "AMMONIA" in the last 168 hours. CBC: Recent Labs  Lab 01/29/23 1019 01/30/23 0019 01/30/23 0610 01/31/23 0554  WBC 4.9  --  5.2 6.1  HGB 3.9* 6.7* 8.0* 8.1*  HCT 15.8* 23.4* 26.8* 27.0*  MCV 66.1*  --  75.9* 76.5*  PLT 608*  --  465* 445*   Cardiac  Enzymes: No results for input(s): "CKTOTAL", "CKMB", "CKMBINDEX", "TROPONINI" in the last 168 hours. BNP: Invalid input(s): "POCBNP" CBG: No results for input(s): "GLUCAP" in the last 168 hours. D-Dimer No results for input(s): "DDIMER" in the last 72 hours. Hgb A1c No results for input(s): "HGBA1C" in the last 72 hours. Lipid Profile No results for input(s): "CHOL", "HDL", "LDLCALC", "TRIG", "CHOLHDL", "LDLDIRECT" in the last 72 hours. Thyroid function studies Recent Labs    01/30/23 0850  TSH 7.206*   Anemia work up Recent Labs    01/29/23 1100  VITAMINB12 308  FOLATE 24.9  FERRITIN 4*  TIBC 455*  IRON 12*  RETICCTPCT 1.6   Urinalysis    Component Value Date/Time   COLORURINE STRAW (A) 11/07/2020 1230   APPEARANCEUR CLEAR 11/07/2020 1230   LABSPEC 1.004 (L) 11/07/2020 1230   PHURINE 7.0 11/07/2020 1230   GLUCOSEU NEGATIVE 11/07/2020 1230   HGBUR NEGATIVE 11/07/2020 1230   BILIRUBINUR NEGATIVE 11/07/2020 1230   KETONESUR NEGATIVE 11/07/2020 1230   PROTEINUR NEGATIVE 11/07/2020 1230   UROBILINOGEN 0.2 06/05/2011 1008   NITRITE NEGATIVE 11/07/2020 1230   LEUKOCYTESUR SMALL (A) 11/07/2020 1230   Sepsis Labs Recent Labs  Lab 01/29/23 1019 01/30/23 0610 01/31/23 0554  WBC 4.9 5.2 6.1   Microbiology Recent Results (from the past 240 hours)  Respiratory (~20 pathogens) panel by PCR     Status: Abnormal   Collection Time: 01/30/23 11:10 AM    Specimen: Anterior Nasal Swab; Respiratory  Result Value Ref Range Status   Adenovirus NOT DETECTED NOT DETECTED Final   Coronavirus 229E NOT DETECTED NOT DETECTED Final    Comment: (NOTE) The Coronavirus on the Respiratory Panel, DOES NOT test for the novel  Coronavirus (2019 nCoV)    Coronavirus HKU1 NOT DETECTED NOT DETECTED Final   Coronavirus NL63 NOT DETECTED NOT DETECTED Final   Coronavirus OC43 NOT DETECTED NOT DETECTED Final   Metapneumovirus NOT DETECTED NOT DETECTED Final   Rhinovirus / Enterovirus NOT DETECTED NOT DETECTED Final   Influenza A H3 DETECTED (A) NOT DETECTED Final   Influenza B NOT DETECTED NOT DETECTED Final   Parainfluenza Virus 1 NOT DETECTED NOT DETECTED Final   Parainfluenza Virus 2 NOT DETECTED NOT DETECTED Final   Parainfluenza Virus 3 NOT DETECTED NOT DETECTED Final   Parainfluenza Virus 4 NOT DETECTED NOT DETECTED Final   Respiratory Syncytial Virus NOT DETECTED NOT DETECTED Final   Bordetella pertussis NOT DETECTED NOT DETECTED Final   Bordetella Parapertussis NOT DETECTED NOT DETECTED Final   Chlamydophila pneumoniae NOT DETECTED NOT DETECTED Final   Mycoplasma pneumoniae NOT DETECTED NOT DETECTED Final    Comment: Performed at [facility], [address] [place], [place] [address]  SARS Coronavirus 2 by RT PCR (hospital order, performed in [facility] hospital lab) *cepheid single result test* Anterior Nasal Swab     Status: None   Collection Time: 01/30/23 11:15 AM   Specimen: Anterior Nasal Swab  Result Value Ref Range Status   SARS Coronavirus 2 by RT PCR NEGATIVE NEGATIVE Final    Comment: (NOTE) SARS-CoV-2 target nucleic acids are NOT DETECTED.  The SARS-CoV-2 RNA is generally detectable in upper and lower respiratory specimens during the acute phase of infection. The lowest concentration of SARS-CoV-2 viral copies this assay can detect is 250 copies / mL. A negative result does not preclude SARS-CoV-2 infection and should not be  used as the sole basis for treatment or other patient management decisions.  A negative result may  occur with improper specimen collection / handling, submission of specimen other than nasopharyngeal swab, presence of viral mutation(s) within the areas targeted by this assay, and inadequate number of viral copies (<250 copies / mL). A negative result must be combined with clinical observations, patient history, and epidemiological information.  Fact Sheet for Patients:   RoadLapTop.co.za  Fact Sheet for Healthcare Providers: http://kim-miller.com/  This test is not yet approved or  cleared by the [place] FDA and has been authorized for detection and/or diagnosis of SARS-CoV-2 by FDA under an Emergency Use Authorization (EUA).  This EUA will remain in effect (meaning this test can be used) for the duration of the COVID-19 declaration under Section 564(b)(1) of the Act, 21 U.S.C. section 360bbb-3(b)(1), unless the authorization is terminated or revoked sooner.  Performed at [facility], [address] [place], [place] [address]      Time coordinating discharge: 25 minutes  SIGNED: Lanae Boast, MD  [facility] 01/31/2023, 11:23 AM  If 7PM-7AM, please contact night-coverage [email or web address]

## 2023-01-31 NOTE — Hospital Course (Addendum)
48 year old with past medical history significant for chronic blood loss anemia, hyper menorrhagia, hemorrhoids with occasional bleed presents to the ED complaining of dyspnea, palpitation, fatigue after she had her last menstrual period. She has not been  taking iron supplements due to GI upset. She presented with severe anemia with a hemoglobin of 3.9.  Chest x-ray showed subtle blunting of the left lateral costophrenic angle.  May represent trace left pleural effusion. 3 units of packed red blood cell  was  ordered and admitted for further management. Patient overall hemodynamically stable afebrile.  Hemoglobin appropriately increased to 8 g and holding stable, received IV iron She will need close follow-up with PCP, GYN and continue her iron supplementation

## 2023-02-01 LAB — T3, FREE: T3, Free: 3 pg/mL (ref 2.0–4.4)

## 2023-02-06 ENCOUNTER — Ambulatory Visit (INDEPENDENT_AMBULATORY_CARE_PROVIDER_SITE_OTHER): Payer: PRIVATE HEALTH INSURANCE | Admitting: Obstetrics & Gynecology

## 2023-02-06 ENCOUNTER — Other Ambulatory Visit (HOSPITAL_COMMUNITY)
Admission: RE | Admit: 2023-02-06 | Discharge: 2023-02-06 | Disposition: A | Discharge status: Home / Self Care | Payer: PRIVATE HEALTH INSURANCE | Source: Ambulatory Visit | Attending: Obstetrics & Gynecology | Admitting: Obstetrics & Gynecology

## 2023-02-06 ENCOUNTER — Encounter: Payer: Self-pay | Admitting: Obstetrics & Gynecology

## 2023-02-06 VITALS — BP 143/83 | HR 82 | Ht 72.0 in

## 2023-02-06 DIAGNOSIS — Z01419 Encounter for gynecological examination (general) (routine) without abnormal findings: Secondary | ICD-10-CM | POA: Insufficient documentation

## 2023-02-06 DIAGNOSIS — R102 Pelvic and perineal pain: Secondary | ICD-10-CM

## 2023-02-06 DIAGNOSIS — D219 Benign neoplasm of connective and other soft tissue, unspecified: Secondary | ICD-10-CM | POA: Diagnosis not present

## 2023-02-06 DIAGNOSIS — D500 Iron deficiency anemia secondary to blood loss (chronic): Secondary | ICD-10-CM | POA: Diagnosis not present

## 2023-02-06 MED ORDER — LUPRON DEPOT (3-MONTH) 11.25 MG IM KIT: 11.2500 mg | PACK | INTRAMUSCULAR | 0 refills | Status: DC

## 2023-02-06 NOTE — Progress Notes (Signed)
 Chief Complaint  Patient presents with   Gynecologic Exam    Pap/physcial`      48 y.o. G1P1001 Patient's last menstrual period was 01/18/2023. The current method of family planning is none.  Outpatient Encounter Medications as of 02/06/2023  Medication Sig   ferrous sulfate 325 (65 FE) MG tablet Take 325 mg by mouth daily.   leuprolide  (LUPRON  DEPOT, 47-MONTH,) 11.25 MG injection Inject 11.25 mg into the muscle every 3 (three) months.   Vitamin D , Ergocalciferol , (DRISDOL ) 1.25 MG (50000 UNIT) CAPS capsule Take 50,000 Units by mouth once a week.   methocarbamol  (ROBAXIN ) 500 MG tablet Take 1 tablet (500 mg total) by mouth every 6 (six) hours as needed for muscle spasms. (Patient not taking: Reported on 02/06/2023)   No facility-administered encounter medications on file as of 02/06/2023.    Subjective Patient is referred to my office after having been seen at [facility] recently for mental metrorrhagia fibroid severe anemia requiring multiple transfusions She is known to have fibroids but has not had a sonogram since 2016 Hemoglobin at presentation to was along was 3.9  She is her periods have been quite heavy for quite some time prolonged 10 days or so with clotting quite a bit of discomfort And abdominal exam her uterus is 12 cm above her umbilicus approaching the liver   Past Medical History:  Diagnosis Date   Anemia    Cholecystitis 06/05/2011   H/O hiatal hernia    Hernia     Past Surgical History:  Procedure Laterality Date   BREAST SURGERY  2002   mass removed from both breasts   CHOLECYSTECTOMY  06/06/2011   Procedure: LAPAROSCOPIC CHOLECYSTECTOMY WITH INTRAOPERATIVE CHOLANGIOGRAM;  Surgeon: Elon CHRISTELLA Pacini, MD;  Location: [facility] OR;  Service: General;  Laterality: N/A;    OB History     Gravida  1   Para  1   Term  1   Preterm      AB      Living  1      SAB      IAB      Ectopic      Multiple      Live Births  1            Allergies  Allergen Reactions   Amoxicillin Other (See Comments)    Caused bad yeast infection    Social History   Socioeconomic History   Marital status: Single    Spouse name: Not on file   Number of children: Not on file   Years of education: Not on file   Highest education level: Not on file  Occupational History   Not on file  Tobacco Use   Smoking status: Never   Smokeless tobacco: Never  Vaping Use   Vaping status: Never Used  Substance and Sexual Activity   Alcohol use: Yes    Alcohol/week: 0.0 standard drinks of alcohol    Comment: occasional   Drug use: No   Sexual activity: Not Currently    Partners: Male  Other Topics Concern   Not on file  Social History Narrative   Not on file   Social Drivers of Health   Financial Resource Strain: Low Risk  (02/06/2023)   Overall Financial Resource Strain (CARDIA)    Difficulty of Paying Living Expenses: Not very hard  Food Insecurity: No Food Insecurity (02/06/2023)   Hunger Vital Sign    Worried About Running Out of Food  in the Last Year: Never true    Ran Out of Food in the Last Year: Never true  Transportation Needs: No Transportation Needs (02/06/2023)   PRAPARE - Administrator, Civil Service (Medical): No    Lack of Transportation (Non-Medical): No  Physical Activity: Insufficiently Active (02/06/2023)   Exercise Vital Sign    Days of Exercise per Week: 1 day    Minutes of Exercise per Session: 20 min  Stress: No Stress Concern Present (02/06/2023)   [employer] of Occupational Health - Occupational Stress Questionnaire    Feeling of Stress : Not at all  Social Connections: Moderately Integrated (02/06/2023)   Social Connection and Isolation Panel [NHANES]    Frequency of Communication with Friends and Family: Three times a week    Frequency of Social Gatherings with Friends and Family: Three times a week    Attends Religious Services: 1 to 4 times per year    Active Member of Clubs or  Organizations: No    Attends Banker Meetings: 1 to 4 times per year    Marital Status: Never married    Family History  Problem Relation Age of Onset   Hypertension Mother    Diabetes Mother    Stroke Maternal Grandmother     Medications:       Current Outpatient Medications:    ferrous sulfate 325 (65 FE) MG tablet, Take 325 mg by mouth daily., Disp: , Rfl:    leuprolide  (LUPRON  DEPOT, 73-MONTH,) 11.25 MG injection, Inject 11.25 mg into the muscle every 3 (three) months., Disp: 1 each, Rfl: 0   Vitamin D , Ergocalciferol , (DRISDOL ) 1.25 MG (50000 UNIT) CAPS capsule, Take 50,000 Units by mouth once a week., Disp: , Rfl:    methocarbamol  (ROBAXIN ) 500 MG tablet, Take 1 tablet (500 mg total) by mouth every 6 (six) hours as needed for muscle spasms. (Patient not taking: Reported on 02/06/2023), Disp: 30 tablet, Rfl: 0  Objective Blood pressure (!) 143/83, pulse 82, height 6' (1.829 m), last menstrual period 01/18/2023.  Gen WDWN NAD Thyroid is normal Exam without skin changes no masses or tenderness no nipple changes Abdomen is soft with an obvious mass it measures 12 cm above the umbilicus it approaches the liver and the right upper quadrant She has normal external genitalia Was pink and moist eye discharge Cervix is parous in  appearance no lesions Uterus feels the entire pelvic sidewall to sidewall and of course rises into the upper abdomen as described above  Pertinent ROS Per HPI No burning with urination, frequency or urgency No nausea, vomiting or diarrhea Nor fever chills or other constitutional symptoms    Labs or studies    Latest Ref Rng & Units 01/31/2023    5:54 AM 01/30/2023    6:10 AM 01/30/2023   12:19 AM  CBC  WBC 4.0 - 10.5 K/uL 6.1  5.2    Hemoglobin 12.0 - 15.0 g/dL 8.1  8.0  6.7   Hematocrit 36.0 - 46.0 % 27.0  26.8  23.4   Platelets 150 - 400 K/uL 445  465         Impression + Management Plan: Diagnoses this Encounter::   ICD-10-CM    1. Fibroids, 32 weeks size, approaching the liver  D21.9     2. Anemia due to chronic blood loss  D50.0     3. Encounter for gynecological examination with Papanicolaou smear of cervix  Z01.419 Cytology - PAP( [place])  4. Pelvic pain> constipation and pressure  R10.2      This patient is going to have to have surgery To see if her insurance will cover Lupron  if it does that would be great to build up her blood counts and also shrink her uterus which may allow for the possibility of robotic removal It does not shrink safe to 24 weeks size or so then I will recommend abdominal hysterectomy  If her insurance does not cover Lupron  I would put her on megestrol  and try to get her blood counts up to preoperatively and then we will probably do an abdominal hysterectomy because of tremendous uterine size Think there is a place I can get the trocars and her uterus is so large and pushes against her abdominal wall unless I can shrink it with with Lupron    Medications prescribed during  this encounter: Meds ordered this encounter  Medications   leuprolide  (LUPRON  DEPOT, 64-MONTH,) 11.25 MG injection    Sig: Inject 11.25 mg into the muscle every 3 (three) months.    Dispense:  1 each    Refill:  0    Labs or Scans Ordered during this encounter: No orders of the defined types were placed in this encounter.     Follow up Return for will schedule.

## 2023-02-07 ENCOUNTER — Encounter: Payer: Self-pay | Admitting: Obstetrics & Gynecology

## 2023-02-12 ENCOUNTER — Telehealth: Payer: Self-pay

## 2023-02-12 NOTE — Telephone Encounter (Signed)
 Patient called and stated that Dr. Randolm Butte called her in leuprolide , her pharmacy cannot get the medication.  Patient would like to know if there is a alternative that he can call in.

## 2023-02-12 NOTE — Telephone Encounter (Signed)
 Patient states [facility] is unable to get Lupron  injection.

## 2023-02-12 NOTE — Telephone Encounter (Signed)
Returned patient's call.  VM full. 

## 2023-02-15 LAB — CYTOLOGY - PAP
Chlamydia: NEGATIVE
Comment: NEGATIVE
Comment: NEGATIVE
Comment: NORMAL
Diagnosis: NEGATIVE
High risk HPV: NEGATIVE
Neisseria Gonorrhea: NEGATIVE

## 2023-02-26 ENCOUNTER — Ambulatory Visit: Payer: Managed Care, Other (non HMO) | Admitting: Obstetrics & Gynecology

## 2023-08-27 ENCOUNTER — Telehealth: Payer: Self-pay | Admitting: Obstetrics & Gynecology

## 2023-08-27 NOTE — Telephone Encounter (Signed)
 Patient would like you to send in her Vitamin D  rx to [facility] on [address]. Please advise.

## 2023-08-27 NOTE — Telephone Encounter (Signed)
 LMOVM returning patient's call.

## 2023-08-27 NOTE — Telephone Encounter (Signed)
 Spoke to patient. States she is needing a refill on her iron  and Vitamin D  as her PCP will not refill it.  Informed patient both are available over the counter. Pt also needing to make a f/u appt to discuss fibroids and possible hysterectomy.  Appt made.  No further questions.

## 2023-08-29 ENCOUNTER — Other Ambulatory Visit (HOSPITAL_COMMUNITY): Payer: Self-pay

## 2023-08-30 ENCOUNTER — Other Ambulatory Visit (HOSPITAL_COMMUNITY): Payer: Self-pay

## 2023-08-30 ENCOUNTER — Telehealth: Payer: Self-pay | Admitting: Obstetrics & Gynecology

## 2023-08-30 ENCOUNTER — Other Ambulatory Visit (HOSPITAL_COMMUNITY): Payer: Self-pay | Admitting: Obstetrics & Gynecology

## 2023-08-30 MED ORDER — ERGOCALCIFEROL 1.25 MG (50000 UT) PO CAPS
50000.0000 [IU] | ORAL_CAPSULE | ORAL | 3 refills | Status: DC
  Filled 2023-08-30: qty 4, 28d supply, fill #0
  Filled 2023-10-09: qty 4, 28d supply, fill #1
  Filled 2023-12-03: qty 4, 28d supply, fill #2

## 2023-08-30 MED ORDER — FERROUS GLUCONATE 324 (38 FE) MG PO TABS
324.0000 mg | ORAL_TABLET | ORAL | 4 refills | Status: DC
  Filled 2023-08-30: qty 100, 200d supply, fill #0

## 2023-08-30 NOTE — Telephone Encounter (Signed)
 Called pharmacy to get clarification on request for iron . Pharmacy states patient is able to get ferrous sulfate OTC but patient is wanting Vitamin D  50,000 units sent in.  Informed pharmacy that we have not been following patient for her vitamin D . PCP has last checked level in 06/2021. Advised to either check with PCP or until her visit with us  on 9/16.

## 2023-08-30 NOTE — Telephone Encounter (Signed)
 [facility] called [facility] stating that the patient is wanting to switch her scripts to [facility] and needs a script for iron  [place]

## 2023-09-17 ENCOUNTER — Telehealth: Payer: Self-pay

## 2023-09-17 NOTE — Telephone Encounter (Signed)
 Called patient to see if she could come in the morning. Voicemail is full.

## 2023-09-18 ENCOUNTER — Ambulatory Visit: Admitting: Obstetrics & Gynecology

## 2023-09-18 ENCOUNTER — Other Ambulatory Visit (HOSPITAL_COMMUNITY): Payer: Self-pay

## 2023-09-18 ENCOUNTER — Other Ambulatory Visit: Payer: Self-pay

## 2023-09-18 ENCOUNTER — Encounter (HOSPITAL_COMMUNITY): Payer: Self-pay

## 2023-09-18 ENCOUNTER — Encounter: Payer: Self-pay | Admitting: Obstetrics & Gynecology

## 2023-09-18 VITALS — BP 149/81 | HR 91 | Ht 72.0 in | Wt 208.0 lb

## 2023-09-18 DIAGNOSIS — D62 Acute posthemorrhagic anemia: Secondary | ICD-10-CM

## 2023-09-18 DIAGNOSIS — N921 Excessive and frequent menstruation with irregular cycle: Secondary | ICD-10-CM

## 2023-09-18 DIAGNOSIS — D219 Benign neoplasm of connective and other soft tissue, unspecified: Secondary | ICD-10-CM

## 2023-09-18 DIAGNOSIS — D259 Leiomyoma of uterus, unspecified: Secondary | ICD-10-CM | POA: Diagnosis not present

## 2023-09-18 LAB — POCT HEMOGLOBIN: Hemoglobin: 4.9 g/dL — AB (ref 11–14.6)

## 2023-09-18 MED ORDER — LUPRON DEPOT (3-MONTH) 11.25 MG IM KIT
11.2500 mg | PACK | INTRAMUSCULAR | 0 refills | Status: DC
  Filled 2023-09-18: qty 1, 90d supply, fill #0

## 2023-09-18 NOTE — Progress Notes (Signed)
 Follow up appointment  Fibroids/menometrorrhgia/IDA  Chief Complaint  Patient presents with   Discuss getting on Lupron -changed insurance    Blood pressure (!) 149/81, pulse 91, height 6' (1.829 m), weight 208 lb (94.3 kg), last menstrual period 08/29/2023.  Known patient Could not get the lupron  previously because of insurance, will try again Will do prior auth based on conversation with pharmacist  Quite anemic but she is stable and asymptomatic    MEDS ordered this encounter: Meds ordered this encounter  Medications   leuprolide  (LUPRON  DEPOT, 16-MONTH,) 11.25 MG injection    Sig: Inject 11.25 mg into the muscle every 3 (three) months.    Dispense:  1 each    Refill:  0    Orders for this encounter: Orders Placed This Encounter  Procedures   POCT hemoglobin    Impression + Management Plan   ICD-10-CM   1. Fibroids, 32 weeks size, approaching the liver  D21.9     2. Acute on chronic blood loss anemia  D62 POCT hemoglobin    3. Menometrorrhagia  N92.1       Follow Up: Return in about 6 weeks (around 10/30/2023) for Follow up, with Dr Jayne.     All questions were answered.  Past Medical History:  Diagnosis Date   Anemia    Cholecystitis 06/05/2011   H/O hiatal hernia    Hernia     Past Surgical History:  Procedure Laterality Date   BREAST SURGERY  2002   mass removed from both breasts   CHOLECYSTECTOMY  06/06/2011   Procedure: LAPAROSCOPIC CHOLECYSTECTOMY WITH INTRAOPERATIVE CHOLANGIOGRAM;  Surgeon: Elon CHRISTELLA Pacini, MD;  Location: [facility] OR;  Service: General;  Laterality: N/A;    OB History     Gravida  3   Para  1   Term  1   Preterm      AB  2   Living  1      SAB  2   IAB      Ectopic      Multiple      Live Births  1           Allergies  Allergen Reactions   Amoxicillin Other (See Comments)    Caused bad yeast infection    Social History   Socioeconomic History   Marital status: Single    Spouse name: Not on  file   Number of children: Not on file   Years of education: Not on file   Highest education level: Not on file  Occupational History   Not on file  Tobacco Use   Smoking status: Never   Smokeless tobacco: Never  Vaping Use   Vaping status: Never Used  Substance and Sexual Activity   Alcohol use: Yes    Alcohol/week: 0.0 standard drinks of alcohol    Comment: occasional   Drug use: No   Sexual activity: Not Currently    Partners: Male    Birth control/protection: None  Other Topics Concern   Not on file  Social History Narrative   Not on file   Social Drivers of Health   Financial Resource Strain: Low Risk  (02/06/2023)   Overall Financial Resource Strain (CARDIA)    Difficulty of Paying Living Expenses: Not very hard  Food Insecurity: No Food Insecurity (02/06/2023)   Hunger Vital Sign    Worried About Running Out of Food in the Last Year: Never true    Ran Out of Food in the Last Year: Never  true  Transportation Needs: No Transportation Needs (02/06/2023)   PRAPARE - Administrator, Civil Service (Medical): No    Lack of Transportation (Non-Medical): No  Physical Activity: Insufficiently Active (02/06/2023)   Exercise Vital Sign    Days of Exercise per Week: 1 day    Minutes of Exercise per Session: 20 min  Stress: No Stress Concern Present (02/06/2023)   [employer] of Occupational Health - Occupational Stress Questionnaire    Feeling of Stress : Not at all  Social Connections: Moderately Integrated (02/06/2023)   Social Connection and Isolation Panel    Frequency of Communication with Friends and Family: Three times a week    Frequency of Social Gatherings with Friends and Family: Three times a week    Attends Religious Services: 1 to 4 times per year    Active Member of Clubs or Organizations: No    Attends Banker Meetings: 1 to 4 times per year    Marital Status: Never married    Family History  Problem Relation Age of Onset    Hypertension Mother    Diabetes Mother    Stroke Maternal Grandmother

## 2023-09-19 ENCOUNTER — Encounter: Payer: Self-pay | Admitting: *Deleted

## 2023-09-24 ENCOUNTER — Other Ambulatory Visit (HOSPITAL_COMMUNITY): Payer: Self-pay | Admitting: Obstetrics & Gynecology

## 2023-09-24 ENCOUNTER — Other Ambulatory Visit: Payer: Self-pay

## 2023-09-24 ENCOUNTER — Observation Stay (HOSPITAL_COMMUNITY)
Admission: AD | Admit: 2023-09-24 | Discharge: 2023-09-25 | Disposition: A | Discharge status: Home / Self Care | Attending: Obstetrics & Gynecology | Admitting: Obstetrics & Gynecology

## 2023-09-24 DIAGNOSIS — O99019 Anemia complicating pregnancy, unspecified trimester: Principal | ICD-10-CM | POA: Insufficient documentation

## 2023-09-24 DIAGNOSIS — D649 Anemia, unspecified: Secondary | ICD-10-CM

## 2023-09-24 DIAGNOSIS — Z3A00 Weeks of gestation of pregnancy not specified: Secondary | ICD-10-CM | POA: Diagnosis not present

## 2023-09-24 DIAGNOSIS — O3410 Maternal care for benign tumor of corpus uteri, unspecified trimester: Secondary | ICD-10-CM | POA: Insufficient documentation

## 2023-09-24 DIAGNOSIS — D259 Leiomyoma of uterus, unspecified: Secondary | ICD-10-CM | POA: Diagnosis present

## 2023-09-24 DIAGNOSIS — D219 Benign neoplasm of connective and other soft tissue, unspecified: Secondary | ICD-10-CM | POA: Diagnosis present

## 2023-09-24 LAB — COMPREHENSIVE METABOLIC PANEL WITH GFR
ALT: 9 U/L (ref 0–44)
AST: 15 U/L (ref 15–41)
Albumin: 3.5 g/dL (ref 3.5–5.0)
Alkaline Phosphatase: 64 U/L (ref 38–126)
Anion gap: 8 (ref 5–15)
BUN: 8 mg/dL (ref 6–20)
CO2: 24 mmol/L (ref 22–32)
Calcium: 8.6 mg/dL — ABNORMAL LOW (ref 8.9–10.3)
Chloride: 105 mmol/L (ref 98–111)
Creatinine, Ser: 0.96 mg/dL (ref 0.44–1.00)
GFR, Estimated: >60 mL/min (ref >60)
Glucose, Bld: 82 mg/dL (ref 70–99)
Potassium: 3.2 mmol/L — ABNORMAL LOW (ref 3.5–5.1)
Sodium: 137 mmol/L (ref 135–145)
Total Bilirubin: 0.8 mg/dL (ref 0.0–1.2)
Total Protein: 7.5 g/dL (ref 6.5–8.1)

## 2023-09-24 LAB — CBC WITH DIFFERENTIAL/PLATELET
Abs Immature Granulocytes: 0.02 K/uL (ref 0.00–0.07)
Basophils Absolute: 0 K/uL (ref 0.0–0.1)
Basophils Relative: 1 %
Eosinophils Absolute: 0.1 K/uL (ref 0.0–0.5)
Eosinophils Relative: 2 %
HCT: 22.1 % — ABNORMAL LOW (ref 36.0–46.0)
Hemoglobin: 5.7 g/dL — CL (ref 12.0–15.0)
Immature Granulocytes: 0 %
Lymphocytes Relative: 28 %
Lymphs Abs: 1.5 K/uL (ref 0.7–4.0)
MCH: 18.4 pg — ABNORMAL LOW (ref 26.0–34.0)
MCHC: 25.8 g/dL — ABNORMAL LOW (ref 30.0–36.0)
MCV: 71.3 fL — ABNORMAL LOW (ref 80.0–100.0)
Monocytes Absolute: 0.3 K/uL (ref 0.1–1.0)
Monocytes Relative: 6 %
Neutro Abs: 3.5 K/uL (ref 1.7–7.7)
Neutrophils Relative %: 63 %
Platelets: 547 K/uL — ABNORMAL HIGH (ref 150–400)
RBC: 3.1 MIL/uL — ABNORMAL LOW (ref 3.87–5.11)
RDW: 20.1 % — ABNORMAL HIGH (ref 11.5–15.5)
WBC: 5.5 K/uL (ref 4.0–10.5)
nRBC: 0 % (ref 0.0–0.2)

## 2023-09-24 LAB — PREPARE RBC (CROSSMATCH)

## 2023-09-24 MED ORDER — IBUPROFEN 600 MG PO TABS
600.0000 mg | ORAL_TABLET | Freq: Four times a day (QID) | ORAL | Status: DC | PRN
Start: 2023-09-24 — End: 2023-09-24

## 2023-09-24 MED ORDER — ONDANSETRON HCL 4 MG/2ML IJ SOLN
4.0000 mg | Freq: Four times a day (QID) | INTRAMUSCULAR | Status: DC | PRN
Start: 2023-09-24 — End: 2023-09-24

## 2023-09-24 MED ORDER — SODIUM CHLORIDE 0.9% IV SOLUTION: Freq: Once | INTRAVENOUS | Status: AC

## 2023-09-24 MED ORDER — PRENATAL MULTIVITAMIN CH
1.0000 | ORAL_TABLET | Freq: Every day | ORAL | Status: DC
Start: 2023-09-24 — End: 2023-09-24

## 2023-09-24 MED ORDER — DIPHENHYDRAMINE HCL 25 MG PO CAPS
25.0000 mg | ORAL_CAPSULE | Freq: Once | ORAL | Status: AC
  Administered 2023-09-24: 25 mg via ORAL
  Filled 2023-09-24: qty 1

## 2023-09-24 MED ORDER — POTASSIUM CHLORIDE CRYS ER 20 MEQ PO TBCR: 20.0000 meq | EXTENDED_RELEASE_TABLET | Freq: Every day | ORAL | Status: DC

## 2023-09-24 MED ORDER — ONDANSETRON HCL 4 MG PO TABS: 4.0000 mg | ORAL_TABLET | Freq: Four times a day (QID) | ORAL | Status: DC | PRN

## 2023-09-24 MED ORDER — POTASSIUM CHLORIDE CRYS ER 20 MEQ PO TBCR
20.0000 meq | EXTENDED_RELEASE_TABLET | Freq: Two times a day (BID) | ORAL | Status: DC
  Administered 2023-09-24 – 2023-09-25 (×2): 20 meq via ORAL
  Filled 2023-09-24 (×2): qty 1

## 2023-09-24 MED ORDER — LEUPROLIDE ACETATE (3 MONTH) 11.25 MG IM KIT: 11.2500 mg | PACK | Freq: Once | INTRAMUSCULAR | Status: DC

## 2023-09-24 MED ORDER — SODIUM CHLORIDE 0.9% FLUSH
3.0000 mL | Freq: Two times a day (BID) | INTRAVENOUS | Status: DC
  Administered 2023-09-24 – 2023-09-25 (×2): 3 mL via INTRAVENOUS

## 2023-09-24 MED ORDER — SODIUM CHLORIDE 0.9 % IV SOLN
250.0000 mL | INTRAVENOUS | Status: AC | PRN
  Administered 2023-09-24: 250 mL via INTRAVENOUS

## 2023-09-24 MED ORDER — SODIUM CHLORIDE 0.9% IV SOLUTION
Freq: Once | INTRAVENOUS | Status: DC
Start: 2023-09-24 — End: 2023-09-24

## 2023-09-24 MED ORDER — SODIUM CHLORIDE 0.9% FLUSH
3.0000 mL | INTRAVENOUS | Status: DC | PRN
Start: 2023-09-24 — End: 2023-09-24

## 2023-09-24 MED ORDER — ACETAMINOPHEN 325 MG PO TABS
650.0000 mg | ORAL_TABLET | Freq: Once | ORAL | Status: AC
  Administered 2023-09-24: 650 mg via ORAL
  Filled 2023-09-24: qty 2

## 2023-09-24 MED ORDER — VITAMIN D (ERGOCALCIFEROL) 1.25 MG (50000 UNIT) PO CAPS
50000.0000 [IU] | ORAL_CAPSULE | ORAL | Status: DC
  Administered 2023-09-24: 50000 [IU] via ORAL
  Filled 2023-09-24: qty 1

## 2023-09-24 MED ORDER — SODIUM CHLORIDE 0.9 % IV SOLN: 250.0000 mL | INTRAVENOUS | Status: DC | PRN

## 2023-09-24 MED ORDER — DIPHENHYDRAMINE HCL 25 MG PO CAPS: 25.0000 mg | ORAL_CAPSULE | Freq: Once | ORAL | Status: DC

## 2023-09-24 MED ORDER — ACETAMINOPHEN 325 MG PO TABS: 650.0000 mg | ORAL_TABLET | Freq: Once | ORAL | Status: DC

## 2023-09-24 MED ORDER — FUROSEMIDE 10 MG/ML IJ SOLN
20.0000 mg | Freq: Once | INTRAMUSCULAR | Status: AC
  Administered 2023-09-25: 20 mg via INTRAVENOUS
  Filled 2023-09-24: qty 2

## 2023-09-24 MED ORDER — SODIUM CHLORIDE 0.9% FLUSH: 3.0000 mL | Freq: Two times a day (BID) | INTRAVENOUS | Status: DC

## 2023-09-24 MED ORDER — IBUPROFEN 600 MG PO TABS: 600.0000 mg | ORAL_TABLET | Freq: Four times a day (QID) | ORAL | Status: DC | PRN

## 2023-09-24 MED ORDER — PRENATAL MULTIVITAMIN CH: 1.0000 | ORAL_TABLET | Freq: Every day | ORAL | Status: DC

## 2023-09-24 MED ORDER — FUROSEMIDE 10 MG/ML IJ SOLN: 20.0000 mg | Freq: Once | INTRAMUSCULAR | Status: DC

## 2023-09-24 MED ORDER — SODIUM CHLORIDE 0.9% FLUSH: 3.0000 mL | INTRAVENOUS | Status: DC | PRN

## 2023-09-24 NOTE — Progress Notes (Signed)
 Gynecology Progress Note  Admission Date: 09/24/2023 Current Date: 09/24/2023 6:09 PM  Sheena Wells is a 48 y.o. [id number] HD#1 admitted for symptomatic anemia in setting of fibroid uterus and HVB.    History complicated by: Patient Active Problem List   Diagnosis Date Noted   Acute on chronic blood loss anemia 11/08/2020   Dysfunctional uterine bleeding 11/08/2020   Hypokalemia 11/08/2020   Iron  deficiency anemia due to chronic blood loss 01/02/2017   Symptomatic anemia 01/02/2017   Reactive thrombocytosis 01/02/2017   Hemorrhoids 01/02/2017    ROS and patient/family/surgical history, located on admission H&P note dated 09/24/2023, have been reviewed, and there are no changes except as noted below Yesterday/Overnight Events:  N/A  Subjective:  She is without complaint. No VB at this time. Blood transfusion in process.   Objective:   Vitals:   09/24/23 1230 09/24/23 1537 09/24/23 1607  BP: 136/73 137/78 (!) 140/84  Pulse: 94 97 95  Resp: 16 16 17   Temp: 98.9 F (37.2 C) 98.7 F (37.1 C) 98.9 F (37.2 C)  TempSrc: Oral Oral Oral  SpO2: 100% 97% 99%    Physical exam: General appearance: alert, cooperative, and appears stated age Abdomen: soft, non-tender; bowel sounds normal; no masses,  no organomegaly GU: No gross VB Lungs: clear to auscultation bilaterally Heart:  Regular rate Extremities: no lower extremity edema Skin: Intact Psych: appropriate Neurologic: Grossly normal  Medications Current Facility-Administered Medications  Medication Dose Route Frequency Provider Last Rate Last Admin   0.9 %  sodium chloride  infusion  250 mL Intravenous PRN Jayne Vonn DEL, MD       furosemide  (LASIX ) injection 20 mg  20 mg Intravenous Once Jayne Vonn DEL, MD       ibuprofen  (ADVIL ) tablet 600 mg  600 mg Oral Q6H PRN Jayne Vonn DEL, MD       leuprolide  (LUPRON ) injection 11.25 mg  11.25 mg Intramuscular Once Jayne Vonn DEL, MD       prenatal multivitamin tablet 1 tablet  1  tablet Oral Q1200 Jayne Vonn DEL, MD       sodium chloride  flush (NS) 0.9 % injection 3 mL  3 mL Intravenous Q12H Jayne Vonn DEL, MD   3 mL at 09/24/23 1430   sodium chloride  flush (NS) 0.9 % injection 3 mL  3 mL Intravenous PRN Jayne Vonn DEL, MD       Vitamin D  (Ergocalciferol ) (DRISDOL ) 1.25 MG (50000 UNIT) capsule 50,000 Units  50,000 Units Oral Q7 days Jayne Vonn DEL, MD   50,000 Units at 09/24/23 1738      Labs  Recent Labs  Lab 09/18/23 1117 09/24/23 1335  WBC  --  5.5  HGB 4.9* 5.7*  HCT  --  22.1*  PLT  --  547*    Recent Labs  Lab 09/24/23 1335  NA 137  K 3.2*  CL 105  CO2 24  BUN 8  CREATININE 0.96  CALCIUM 8.6*  PROT 7.5  BILITOT 0.8  ALKPHOS 64  ALT 9  AST 15  GLUCOSE 82    Assessment & Plan:  Fibroid uterus, Symptomatic anemia due to blood loss, Heavy vaginal bleeding/Abnormal uterine bleeding *GYN: Depo Lupron  planned. Pharmacy working on this.  *Heme: Plan is for 3units PRBC with CBC in AM.  Lasix  given.  *Pain: None *FEN/GI: K is 3.2. Will give Kdur.  *PPx: SCDs/early ambulation.  *Dispo: Possible d/c tomorrow assuming Depo lupron  able to be given.   Code Status: Full Code  Vina  Cleatus, MD Attending Center for [facility])

## 2023-09-24 NOTE — H&P (Deleted)
   The note originally documented on this encounter has been moved the the encounter in which it belongs.

## 2023-09-24 NOTE — H&P (Signed)
 History and Physical  Sheena Wells is a 48 y.o. [id number] with Patient's last menstrual period was 08/29/2023. admitted for a transfusion for symptomatic anemia due to menometrorrhagia from fibroid uterus that extends to her liver with multiple fibroids present.  Her hemoglobin was 4.9 on fingerstick last week and we have been trying since 02/2023 to get her Lupron  without success.  We are trying to shrink the uterus in prep for surgery, to be able to do RA TLH which patient prefers. However over the weekend she has gotten weaker and more symtpomatic so she is admitted for transfusion 4 units PRBC + IM Lupron  7.5 mg (seems to be the max dose I can get) prefer 11.25 and plan surgical removal mid November if we get the 7.5, mid December if we can get 11.25.  Patient agrees with this plan   PMH:    Past Medical History:  Diagnosis Date   Anemia    Cholecystitis 06/05/2011   H/O hiatal hernia    Hernia     PSH:     Past Surgical History:  Procedure Laterality Date   BREAST SURGERY  2002   mass removed from both breasts   CHOLECYSTECTOMY  06/06/2011   Procedure: LAPAROSCOPIC CHOLECYSTECTOMY WITH INTRAOPERATIVE CHOLANGIOGRAM;  Surgeon: Elon CHRISTELLA Pacini, MD;  Location: [facility] OR;  Service: General;  Laterality: N/A;    POb/GynH:      OB History     Gravida  3   Para  1   Term  1   Preterm      AB  2   Living  1      SAB  2   IAB      Ectopic      Multiple      Live Births  1           SH:   Social History   Tobacco Use   Smoking status: Never   Smokeless tobacco: Never  Vaping Use   Vaping status: Never Used  Substance Use Topics   Alcohol use: Yes    Alcohol/week: 0.0 standard drinks of alcohol    Comment: occasional   Drug use: No    FH:    Family History  Problem Relation Age of Onset   Hypertension Mother    Diabetes Mother    Stroke Maternal Grandmother      Allergies:  Allergies  Allergen Reactions   Amoxicillin Other (See Comments)     Caused bad yeast infection    Medications:       Current Outpatient Medications:    ergocalciferol  (VITAMIN D2) 1.25 MG (50000 UT) capsule, Take 1 capsule (50,000 Units total) by mouth once a week., Disp: 12 capsule, Rfl: 3   ferrous gluconate  (FERGON) 324 MG tablet, Take 1 tablet (324 mg total) by mouth every other day., Disp: 100 tablet, Rfl: 4   ferrous sulfate 325 (65 FE) MG tablet, Take 325 mg by mouth daily. (Patient not taking: Reported on 09/18/2023), Disp: , Rfl:    leuprolide  (LUPRON  DEPOT, 38-MONTH,) 11.25 MG injection, Inject 11.25 mg into the muscle every 3 (three) months. (Patient not taking: Reported on 09/18/2023), Disp: 1 each, Rfl: 0   leuprolide  (LUPRON  DEPOT, 38-MONTH,) 11.25 MG injection, Inject 11.25 mg into the muscle every 3 (three) months., Disp: 1 each, Rfl: 0   Multiple Vitamin (MULTIVITAMIN) tablet, Take 1 tablet by mouth daily., Disp: , Rfl:    Vitamin D , Ergocalciferol , (DRISDOL ) 1.25 MG (50000 UNIT) CAPS capsule, Take  50,000 Units by mouth once a week., Disp: , Rfl:   Current Facility-Administered Medications:    0.9 %  sodium chloride  infusion (Manually program via Guardrails IV Fluids), , Intravenous, Once, Sheena Wells, Sheena DEL, MD   0.9 %  sodium chloride  infusion, 250 mL, Intravenous, PRN, Sheena Wells, Sheena DEL, MD   acetaminophen  (TYLENOL ) tablet 650 mg, 650 mg, Oral, Once, Sheena Wells, Sheena DEL, MD   diphenhydrAMINE  (BENADRYL ) capsule 25 mg, 25 mg, Oral, Once, Sheena Wells, Sheena DEL, MD   furosemide  (LASIX ) injection 20 mg, 20 mg, Intravenous, Once, Sheena Wells, Sheena DEL, MD   ibuprofen  (ADVIL ) tablet 600 mg, 600 mg, Oral, Q6H PRN, Sheena Wells Sheena DEL, MD   ondansetron  (ZOFRAN ) tablet 4 mg, 4 mg, Oral, Q6H PRN **OR** ondansetron  (ZOFRAN ) injection 4 mg, 4 mg, Intravenous, Q6H PRN, Sheena Wells Sheena DEL, MD   prenatal multivitamin tablet 1 tablet, 1 tablet, Oral, Q1200, Sheena Wells, Sheena DEL, MD   sodium chloride  flush (NS) 0.9 % injection 3 mL, 3 mL, Intravenous, Q12H, Sheena Wells, Sheena DEL, MD   sodium chloride  flush  (NS) 0.9 % injection 3 mL, 3 mL, Intravenous, PRN, Sheena Wells Sheena DEL, MD  Review of Systems:   Review of Systems  Constitutional: Negative for fever, chills, weight loss, malaise/fatigue and diaphoresis.  HENT: Negative for hearing loss, ear pain, nosebleeds, congestion, sore throat, neck pain, tinnitus and ear discharge.   Eyes: Negative for blurred vision, double vision, photophobia, pain, discharge and redness.  Respiratory: Negative for cough, hemoptysis, sputum production, shortness of breath, wheezing and stridor.   Cardiovascular: Negative for chest pain, palpitations, orthopnea, claudication, leg swelling and PND.  Gastrointestinal: Positive for abdominal pain. Negative for heartburn, nausea, vomiting, diarrhea, constipation, blood in stool and melena.  Genitourinary: Negative for dysuria, urgency, frequency, hematuria and flank pain.  Musculoskeletal: Negative for myalgias, back pain, joint pain and falls.  Skin: Negative for itching and rash.  Neurological: Negative for dizziness, tingling, tremors, sensory change, speech change, focal weakness, seizures, loss of consciousness, weakness and headaches.  Endo/Heme/Allergies: Negative for environmental allergies and polydipsia. Does not bruise/bleed easily.  Psychiatric/Behavioral: Negative for depression, suicidal ideas, hallucinations, memory loss and substance abuse. The patient is not nervous/anxious and does not have insomnia.      PHYSICAL EXAM:  Last menstrual period 08/29/2023.    Vitals reviewed. Constitutional: She is oriented to person, place, and time. She appears well-developed and well-nourished.  HENT:  Head: Normocephalic and atraumatic.  Right Ear: External ear normal.  Left Ear: External ear normal.  Nose: Nose normal.  Mouth/Throat: Oropharynx is clear and moist.  Eyes: Conjunctivae and EOM are normal. Pupils are equal, round, and reactive to light. Right eye exhibits no discharge. Left eye exhibits no  discharge. No scleral icterus.  Neck: Normal range of motion. Neck supple. No tracheal deviation present. No thyromegaly present.  Cardiovascular: Normal rate, regular rhythm, normal heart sounds and intact distal pulses.  Exam reveals no gallop and no friction rub.   No murmur heard. Respiratory: Effort normal and breath sounds normal. No respiratory distress. She has no wheezes. She has no rales. She exhibits no tenderness.  GI: Soft. Bowel sounds are normal. She exhibits no distension and no mass. There is tenderness. There is no rebound and no guarding.  Genitourinary:       Vulva is normal without lesions Vagina is pink moist without discharge Cervix normal in appearance and pap is normal Uterus is measures 32 cm extends to her liver on palpation Adnexa is negative with normal sized ovaries  by sonogram  Musculoskeletal: Normal range of motion. She exhibits no edema and no tenderness.  Neurological: She is alert and oriented to person, place, and time. She has normal reflexes. She displays normal reflexes. No cranial nerve deficit. She exhibits normal muscle tone. Coordination normal.  Skin: Skin is warm and dry. No rash noted. No erythema. No pallor.  Psychiatric: She has a normal mood and affect. Her behavior is normal. Judgment and thought content normal.    Labs: Results for orders placed or performed in visit on 09/18/23 (from the past 2 weeks)  POCT hemoglobin   Collection Time: 09/18/23 11:17 AM  Result Value Ref Range   Hemoglobin 4.9 (A) 11 - 14.6 g/dL    EKG: Orders placed or performed during the hospital encounter of 01/29/23   EKG 12-Lead   EKG 12-Lead   ED EKG   ED EKG   EKG   EKG   EKG   EKG   EKG   EKG   EKG   EKG    Imaging Studies: No results found.    Assessment: Fibroids, multiple, measures 32 cm extends to liver Symptomatic anemia  menometrorrhagia  Plan: Transfuse x 4 u PRBC Lupron  11.25 if possible, 7.5 if not  I contacted Dr Cleatus and  Aleck Molt regarding the admission  Sheena VEAR Inch 09/24/2023 11:08 AM

## 2023-09-25 ENCOUNTER — Other Ambulatory Visit (HOSPITAL_COMMUNITY): Payer: Self-pay | Admitting: Obstetrics and Gynecology

## 2023-09-25 ENCOUNTER — Other Ambulatory Visit: Payer: Self-pay | Admitting: Obstetrics and Gynecology

## 2023-09-25 ENCOUNTER — Other Ambulatory Visit (HOSPITAL_COMMUNITY): Payer: Self-pay

## 2023-09-25 ENCOUNTER — Telehealth (HOSPITAL_COMMUNITY): Payer: Self-pay | Admitting: Obstetrics and Gynecology

## 2023-09-25 DIAGNOSIS — D259 Leiomyoma of uterus, unspecified: Secondary | ICD-10-CM | POA: Diagnosis present

## 2023-09-25 DIAGNOSIS — D219 Benign neoplasm of connective and other soft tissue, unspecified: Secondary | ICD-10-CM | POA: Diagnosis present

## 2023-09-25 DIAGNOSIS — O99019 Anemia complicating pregnancy, unspecified trimester: Secondary | ICD-10-CM | POA: Diagnosis not present

## 2023-09-25 LAB — CBC
HCT: 30.5 % — ABNORMAL LOW (ref 36.0–46.0)
Hemoglobin: 8.8 g/dL — ABNORMAL LOW (ref 12.0–15.0)
MCH: 21.6 pg — ABNORMAL LOW (ref 26.0–34.0)
MCHC: 28.9 g/dL — ABNORMAL LOW (ref 30.0–36.0)
MCV: 74.8 fL — ABNORMAL LOW (ref 80.0–100.0)
Platelets: 496 K/uL — ABNORMAL HIGH (ref 150–400)
RBC: 4.08 MIL/uL (ref 3.87–5.11)
RDW: 21.2 % — ABNORMAL HIGH (ref 11.5–15.5)
WBC: 5.9 K/uL (ref 4.0–10.5)
nRBC: 0 % (ref 0.0–0.2)

## 2023-09-25 MED ORDER — MEGESTROL ACETATE 40 MG PO TABS
80.0000 mg | ORAL_TABLET | Freq: Every day | ORAL | 0 refills | Status: DC
  Filled 2023-09-25: qty 60, 30d supply, fill #0

## 2023-09-25 MED ORDER — IBUPROFEN 600 MG PO TABS
600.0000 mg | ORAL_TABLET | Freq: Four times a day (QID) | ORAL | 0 refills | Status: DC | PRN
  Filled 2023-09-25: qty 30, 8d supply, fill #0

## 2023-09-25 NOTE — Discharge Summary (Signed)
 Physician Discharge Summary  Patient ID: Sheena Wells MRN: [id number] DOB/AGE: 1975/06/11 48 y.o.  Admit date: 09/24/2023 Discharge date: 09/25/2023  Admission Diagnoses: Principal Problem:   Symptomatic anemia Active Problems:   Fibroid uterus  Discharge Diagnoses:  Principal Problem:   Symptomatic anemia Active Problems:   Fibroid uterus   Discharged Condition: good  Hospital Course: The patient is a 48 yo admitted for blood transfusion and possible injection of Depo Lupron . She received 3 units of blood after noting HgB of 5.7. After her 3 units, her CBC was rechecked and was 8.8 which was an expected rise. Ultimately after much review, the Depo Lupron  would not be possible to administer while in the hospital. The infusion center has submitted a prior authorization which is pending. We will also arrange for possible Patient Assistance through [employer] directly. In the meantime, the patient will be started on Megace  to help control her bleeding until Depo Lupron  can be administered. The goal for Depo Lupron  is not only to help her symptomatic anemia due to her fibroids but also for reduction in the size of her fibroids in hopes that it may change her surgery from an open surgery to potentially a robotic surgery.   After the blood transfusion, she felt much better and was ready for discharge. She is stable for discharge.   Consults: None  Significant Diagnostic Studies: labs: HgB 5.7 to 8.8 after 3 units PRBC  Treatments: 3 units PRBC  Discharge Exam: Blood pressure (!) 143/81, pulse 84, temperature 98.8 F (37.1 C), temperature source Oral, resp. rate 18, last menstrual period 08/29/2023, SpO2 99%. General appearance: alert, cooperative, and no distress Head: Normocephalic, without obvious abnormality, atraumatic Resp: Normal effort Cardio: Regular rate GI: Soft, nontender Extremities: no edema, redness or tenderness in the calves or thighs Skin: Skin color, texture, turgor  normal. No rashes or lesions Neurologic: Grossly normal  Disposition: Discharge disposition: 01-Home or Self Care       Discharge Instructions     Activity as tolerated - No restrictions   Complete by: As directed    Call MD for:  difficulty breathing, headache or visual disturbances   Complete by: As directed    Call MD for:  persistant nausea and vomiting   Complete by: As directed    Call MD for:  redness, tenderness, or signs of infection (pain, swelling, redness, odor or green/yellow discharge around incision site)   Complete by: As directed    Call MD for:  severe uncontrolled pain   Complete by: As directed    Call MD for:  temperature >100.4   Complete by: As directed    Diet - low sodium heart healthy   Complete by: As directed    May shower / Bathe   Complete by: As directed    No wound care   Complete by: As directed       Allergies as of 09/25/2023       Reactions   Amoxicillin Other (See Comments)   Caused bad yeast infection        Medication List     STOP taking these medications    ferrous sulfate 325 (65 FE) MG tablet       TAKE these medications    ferrous gluconate  324 MG tablet Commonly known as: FERGON Take 1 tablet (324 mg total) by mouth every other day.   ibuprofen  600 MG tablet Commonly known as: ADVIL  Take 1 tablet (600 mg total) by mouth every 6 (six) hours  as needed (mild pain).   Lupron  Depot (64-Month) 11.25 MG injection Generic drug: leuprolide  Inject 11.25 mg into the muscle every 3 (three) months. What changed: Another medication with the same name was removed. Continue taking this medication, and follow the directions you see here.   megestrol  40 MG tablet Commonly known as: MEGACE  Take 2 tablets (80 mg total) by mouth daily. Can increase to two tablets twice a day in the event of heavy bleeding   multivitamin tablet Take 1 tablet by mouth daily.   Vitamin D  (Ergocalciferol ) 1.25 MG (50000 UNIT) Caps  capsule Commonly known as: DRISDOL  Take 50,000 Units by mouth once a week.   Vitamin D  (Ergocalciferol ) 1.25 MG (50000 UNIT) Caps capsule Commonly known as: DRISDOL  Take 1 capsule (50,000 Units total) by mouth once a week.        Follow-up Information     [facility] Follow up.   Specialty: Obstetrics and Gynecology Contact information: [address] [place]  [address] [phone]                Signed: Vina Solian 09/25/2023, 1:29 PM

## 2023-09-25 NOTE — Progress Notes (Signed)
 Spoke with Infusion team and pharmacist. PA in process.   No pre-built therapy plan available. Reviewed dose of Depo Lupron  11.25 mg x1 administration with pharmacist.   Diagnosis codes as noted in problem list.   Will await administration until PA known.   Vina Solian, MD Attending Obstetrician & Gynecologist, [facility] for [facility], [facility]

## 2023-09-25 NOTE — Plan of Care (Signed)

## 2023-09-28 ENCOUNTER — Telehealth (HOSPITAL_COMMUNITY): Payer: Self-pay

## 2023-09-28 LAB — TYPE AND SCREEN
ABO/RH(D): O POS
Antibody Screen: NEGATIVE
Unit division: 0
Unit division: 0
Unit division: 0
Unit division: 0

## 2023-09-28 LAB — BPAM RBC
Blood Product Expiration Date: 202510232359
Blood Product Expiration Date: 202510232359
Blood Product Expiration Date: 202510232359
Blood Product Expiration Date: 202510232359
ISSUE DATE / TIME: 202509221544
ISSUE DATE / TIME: 202509222001
ISSUE DATE / TIME: 202509222252
Unit Type and Rh: 5100
Unit Type and Rh: 5100
Unit Type and Rh: 5100
Unit Type and Rh: 5100

## 2023-09-28 NOTE — Telephone Encounter (Signed)
 Auth Submission: APPROVED Site of care: Site of care: [facility] Payer: [employer] Medication & CPT/J Code(s) submitted: Lupron  ([id number]) Diagnosis Code: D25.9, N93.9, N93.8 Route of submission (phone, fax, portal): portal Phone # Fax # Auth type: Buy/Bill HB Units/visits requested: 11.25mg  q3 months x 2 doses Reference number: [id number] Approval from: 09/25/23 to 12/25/23

## 2023-10-09 ENCOUNTER — Other Ambulatory Visit (HOSPITAL_COMMUNITY): Payer: Self-pay

## 2023-10-10 ENCOUNTER — Encounter (HOSPITAL_COMMUNITY)
Admission: RE | Admit: 2023-10-10 | Discharge: 2023-10-10 | Disposition: A | Discharge status: Home / Self Care | Source: Ambulatory Visit | Attending: Obstetrics & Gynecology | Admitting: Obstetrics & Gynecology

## 2023-10-10 DIAGNOSIS — D259 Leiomyoma of uterus, unspecified: Secondary | ICD-10-CM | POA: Insufficient documentation

## 2023-10-10 MED ORDER — LEUPROLIDE ACETATE (3 MONTH) 11.25 MG IM KIT
11.2500 mg | PACK | Freq: Once | INTRAMUSCULAR | Status: AC
  Administered 2023-10-10: 11.25 mg via INTRAMUSCULAR
  Filled 2023-10-10: qty 11.25

## 2023-10-26 MED ORDER — KETOROLAC TROMETHAMINE 10 MG PO TABS: 10.0000 mg | ORAL_TABLET | Freq: Three times a day (TID) | ORAL | 0 refills | Status: DC | PRN

## 2023-10-26 NOTE — Addendum Note (Signed)
 Addended by: Daniil Labarge H on: 10/26/2023 12:39 PM   Modules accepted: Orders

## 2023-12-03 ENCOUNTER — Other Ambulatory Visit (HOSPITAL_COMMUNITY): Payer: Self-pay

## 2023-12-12 ENCOUNTER — Encounter: Payer: Self-pay | Admitting: Obstetrics & Gynecology

## 2024-01-11 ENCOUNTER — Encounter (HOSPITAL_COMMUNITY)

## 2024-01-16 ENCOUNTER — Encounter: Payer: Self-pay | Admitting: Obstetrics & Gynecology

## 2024-01-16 ENCOUNTER — Ambulatory Visit: Admitting: Obstetrics & Gynecology

## 2024-01-16 VITALS — BP 141/89 | HR 92 | Ht 72.0 in | Wt 206.8 lb

## 2024-01-16 DIAGNOSIS — D649 Anemia, unspecified: Secondary | ICD-10-CM

## 2024-01-16 DIAGNOSIS — D219 Benign neoplasm of connective and other soft tissue, unspecified: Secondary | ICD-10-CM

## 2024-01-16 LAB — POCT HEMOGLOBIN: Hemoglobin: 6.4 g/dL — AB (ref 11–14.6)

## 2024-01-16 NOTE — Progress Notes (Signed)
 Follow up appointment for results: Lupron  response  Chief Complaint  Patient presents with   Follow-up    Blood pressure (!) 141/89, pulse 92, height 6' (1.829 m), weight 206 lb 12.8 oz (93.8 kg).  Huge uterus to the ziphoid, with severe anemia hemoglobin in the 4's Gave Lupron  11.25 in October and uetrus has shrunk probably 33% now about 26 weeks size  MEDS ordered this encounter: No orders of the defined types were placed in this encounter.   Orders for this encounter: Orders Placed This Encounter  Procedures   Ambulatory Referral For Surgery Scheduling   POCT hemoglobin    Impression + Management Plan   ICD-10-CM   1. Fibroids  D21.9 Ambulatory Referral For Surgery Scheduling    2. Severe anemia  D64.9 POCT hemoglobin    RA TLH + bilateral salpingectomy , preserve ovaries if possible  Follow Up: Return in about 30 days (around 02/15/2024) for Post Op, with Dr Jayne.     All questions were answered.  Past Medical History:  Diagnosis Date   Anemia    Cholecystitis 06/05/2011   H/O hiatal hernia    Hernia     Past Surgical History:  Procedure Laterality Date   BREAST SURGERY  2002   mass removed from both breasts   CHOLECYSTECTOMY  06/06/2011   Procedure: LAPAROSCOPIC CHOLECYSTECTOMY WITH INTRAOPERATIVE CHOLANGIOGRAM;  Surgeon: Elon CHRISTELLA Pacini, MD;  Location: [facility] OR;  Service: General;  Laterality: N/A;    OB History     Gravida  3   Para  1   Term  1   Preterm      AB  2   Living  1      SAB  2   IAB      Ectopic      Multiple      Live Births  1           Allergies[1]  Social History   Socioeconomic History   Marital status: Single    Spouse name: Not on file   Number of children: Not on file   Years of education: Not on file   Highest education level: Not on file  Occupational History   Not on file  Tobacco Use   Smoking status: Never   Smokeless tobacco: Never  Vaping Use   Vaping status: Never Used  Substance and  Sexual Activity   Alcohol use: Yes    Alcohol/week: 0.0 standard drinks of alcohol    Comment: occasional   Drug use: No   Sexual activity: Not Currently    Partners: Male    Birth control/protection: None  Other Topics Concern   Not on file  Social History Narrative   Not on file   Social Drivers of Health   Tobacco Use: Low Risk (01/16/2024)   Patient History    Smoking Tobacco Use: Never    Smokeless Tobacco Use: Never    Passive Exposure: Not on file  Financial Resource Strain: Low Risk (02/06/2023)   Overall Financial Resource Strain (CARDIA)    Difficulty of Paying Living Expenses: Not very hard  Food Insecurity: No Food Insecurity (09/24/2023)   Epic    Worried About Radiation Protection Practitioner of Food in the Last Year: Never true    Ran Out of Food in the Last Year: Never true  Transportation Needs: No Transportation Needs (09/24/2023)   Epic    Lack of Transportation (Medical): No    Lack of Transportation (Non-Medical): No  Physical  Activity: Insufficiently Active (02/06/2023)   Exercise Vital Sign    Days of Exercise per Week: 1 day    Minutes of Exercise per Session: 20 min  Stress: No Stress Concern Present (02/06/2023)   [employer] of Occupational Health - Occupational Stress Questionnaire    Feeling of Stress : Not at all  Social Connections: Moderately Integrated (02/06/2023)   Social Connection and Isolation Panel    Frequency of Communication with Friends and Family: Three times a week    Frequency of Social Gatherings with Friends and Family: Three times a week    Attends Religious Services: 1 to 4 times per year    Active Member of Clubs or Organizations: No    Attends Banker Meetings: 1 to 4 times per year    Marital Status: Never married  Depression (PHQ2-9): Low Risk (02/06/2023)   Depression (PHQ2-9)    PHQ-2 Score: 0  Alcohol Screen: Low Risk (02/06/2023)   Alcohol Screen    Last Alcohol Screening Score (AUDIT): 1  Housing: Low Risk (09/24/2023)    Epic    Unable to Pay for Housing in the Last Year: No    Number of Times Moved in the Last Year: 0    Homeless in the Last Year: No  Utilities: Not At Risk (09/24/2023)   Epic    Threatened with loss of utilities: No  Health Literacy: Not on file    Family History  Problem Relation Age of Onset   Hypertension Mother    Diabetes Mother    Stroke Maternal Grandmother        [1]  Allergies Allergen Reactions   Amoxicillin Other (See Comments)    Caused bad yeast infection

## 2024-01-21 ENCOUNTER — Other Ambulatory Visit: Payer: Self-pay | Admitting: Obstetrics & Gynecology

## 2024-01-21 DIAGNOSIS — N852 Hypertrophy of uterus: Secondary | ICD-10-CM

## 2024-01-21 DIAGNOSIS — D251 Intramural leiomyoma of uterus: Secondary | ICD-10-CM

## 2024-01-21 DIAGNOSIS — N939 Abnormal uterine and vaginal bleeding, unspecified: Secondary | ICD-10-CM

## 2024-01-22 ENCOUNTER — Other Ambulatory Visit: Admitting: Radiology

## 2024-01-25 ENCOUNTER — Encounter: Payer: Self-pay | Admitting: *Deleted

## 2024-01-28 ENCOUNTER — Other Ambulatory Visit

## 2024-01-28 ENCOUNTER — Encounter: Payer: Self-pay | Admitting: *Deleted

## 2024-01-31 ENCOUNTER — Ambulatory Visit (INDEPENDENT_AMBULATORY_CARE_PROVIDER_SITE_OTHER)

## 2024-01-31 ENCOUNTER — Other Ambulatory Visit: Admitting: Radiology

## 2024-01-31 ENCOUNTER — Encounter (HOSPITAL_BASED_OUTPATIENT_CLINIC_OR_DEPARTMENT_OTHER): Payer: Self-pay

## 2024-01-31 VITALS — BP 134/89 | HR 92 | Ht 72.0 in | Wt 208.4 lb

## 2024-01-31 DIAGNOSIS — N939 Abnormal uterine and vaginal bleeding, unspecified: Secondary | ICD-10-CM

## 2024-01-31 DIAGNOSIS — D251 Intramural leiomyoma of uterus: Secondary | ICD-10-CM

## 2024-01-31 DIAGNOSIS — Z7689 Persons encountering health services in other specified circumstances: Secondary | ICD-10-CM

## 2024-01-31 DIAGNOSIS — Z1211 Encounter for screening for malignant neoplasm of colon: Secondary | ICD-10-CM | POA: Diagnosis not present

## 2024-01-31 DIAGNOSIS — N852 Hypertrophy of uterus: Secondary | ICD-10-CM

## 2024-01-31 DIAGNOSIS — Z1231 Encounter for screening mammogram for malignant neoplasm of breast: Secondary | ICD-10-CM

## 2024-01-31 NOTE — Progress Notes (Signed)
 "   New Patient Office Visit  Subjective:   Sheena Wells February 03, 1975 01/31/2024  Chief Complaint  Patient presents with   New Patient (Initial Visit)    Patient is here to get established with PCP. Denies any concerns for today's visit.     HPI: Sheena Wells presents today to establish care at [facility] at [facility]. Introduced to publishing rights manager role and practice setting with verbalized understanding by patient.  All questions answered.   Last PCP: years Last annual physical: has only been seeing OBGYN Concerns: See below   Anemia:  States she has known she is anemic and has had uterine fibroids for a while. However in January of last year is when she noticed an increase in bleeding and decrease in blood counts. States she has had two blood transfusions during hospital admissions. Is closely followed by OBGYN in which she is scheduled for a hysterectomy on 2/4.    Colonoscopy referral: Bloody stool in which she has been told she has hemorrhoids during past colonoscopy. With that being said, she is requesting another referral for a colonoscopy to make sure nothing has changed.   Mammogram:  Is requesting referral at today's visit. Has hx of bilateral breast masses in which she has had surgery.   The following portions of the patient's history were reviewed and updated as appropriate: past medical history, past surgical history, family history, social history, allergies, medications, and problem list.   Patient Active Problem List   Diagnosis Date Noted   Fibroid uterus 09/25/2023   Acute on chronic blood loss anemia 11/08/2020   Dysfunctional uterine bleeding 11/08/2020   Hypokalemia 11/08/2020   Iron  deficiency anemia due to chronic blood loss 01/02/2017   Symptomatic anemia 01/02/2017   Reactive thrombocytosis 01/02/2017   Hemorrhoids 01/02/2017   Past Medical History:  Diagnosis Date   Anemia    Cholecystitis 06/05/2011    H/O hiatal hernia    Hernia    Past Surgical History:  Procedure Laterality Date   BREAST SURGERY  2002   mass removed from both breasts   CHOLECYSTECTOMY  06/06/2011   Procedure: LAPAROSCOPIC CHOLECYSTECTOMY WITH INTRAOPERATIVE CHOLANGIOGRAM;  Surgeon: Elon CHRISTELLA Pacini, MD;  Location: [facility] OR;  Service: General;  Laterality: N/A;   Family History  Problem Relation Age of Onset   Hypertension Mother    Diabetes Mother    Stroke Maternal Grandmother    Social History   Socioeconomic History   Marital status: Single    Spouse name: Not on file   Number of children: Not on file   Years of education: Not on file   Highest education level: Not on file  Occupational History   Not on file  Tobacco Use   Smoking status: Never   Smokeless tobacco: Never  Vaping Use   Vaping status: Never Used  Substance and Sexual Activity   Alcohol use: Yes    Alcohol/week: 0.0 standard drinks of alcohol    Comment: occasional   Drug use: No   Sexual activity: Not Currently    Partners: Male    Birth control/protection: None  Other Topics Concern   Not on file  Social History Narrative   Not on file   Social Drivers of Health   Tobacco Use: Low Risk (01/31/2024)   Patient History    Smoking Tobacco Use: Never    Smokeless Tobacco Use: Never    Passive Exposure: Not on file  Financial Resource Strain: Low Risk (  02/06/2023)   Overall Financial Resource Strain (CARDIA)    Difficulty of Paying Living Expenses: Not very hard  Food Insecurity: No Food Insecurity (09/24/2023)   Epic    Worried About Programme Researcher, Broadcasting/film/video in the Last Year: Never true    Ran Out of Food in the Last Year: Never true  Transportation Needs: No Transportation Needs (09/24/2023)   Epic    Lack of Transportation (Medical): No    Lack of Transportation (Non-Medical): No  Physical Activity: Insufficiently Active (02/06/2023)   Exercise Vital Sign    Days of Exercise per Week: 1 day    Minutes of Exercise per Session: 20  min  Stress: No Stress Concern Present (02/06/2023)   [employer] of Occupational Health - Occupational Stress Questionnaire    Feeling of Stress : Not at all  Social Connections: Moderately Integrated (02/06/2023)   Social Connection and Isolation Panel    Frequency of Communication with Friends and Family: Three times a week    Frequency of Social Gatherings with Friends and Family: Three times a week    Attends Religious Services: 1 to 4 times per year    Active Member of Clubs or Organizations: No    Attends Banker Meetings: 1 to 4 times per year    Marital Status: Never married  Intimate Partner Violence: Not At Risk (09/24/2023)   Epic    Fear of Current or Ex-Partner: No    Emotionally Abused: No    Physically Abused: No    Sexually Abused: No  Depression (PHQ2-9): Low Risk (01/31/2024)   Depression (PHQ2-9)    PHQ-2 Score: 0  Alcohol Screen: Low Risk (02/06/2023)   Alcohol Screen    Last Alcohol Screening Score (AUDIT): 1  Housing: Low Risk (09/24/2023)   Epic    Unable to Pay for Housing in the Last Year: No    Number of Times Moved in the Last Year: 0    Homeless in the Last Year: No  Utilities: Not At Risk (09/24/2023)   Epic    Threatened with loss of utilities: No  Health Literacy: Not on file   Outpatient Medications Prior to Visit  Medication Sig Dispense Refill   ergocalciferol  (VITAMIN D2) 1.25 MG (50000 UT) capsule Take 1 capsule (50,000 Units total) by mouth once a week. (Patient not taking: Reported on 01/31/2024) 12 capsule 3   ferrous gluconate  (FERGON) 324 MG tablet Take 1 tablet (324 mg total) by mouth every other day. (Patient not taking: Reported on 01/31/2024) 100 tablet 4   ibuprofen  (ADVIL ) 600 MG tablet Take 1 tablet (600 mg total) by mouth every 6 (six) hours as needed (mild pain). (Patient not taking: Reported on 01/31/2024) 30 tablet 0   ketorolac  (TORADOL ) 10 MG tablet Take 1 tablet (10 mg total) by mouth every 8 (eight) hours as needed.  (Patient not taking: Reported on 01/31/2024) 15 tablet 0   leuprolide  (LUPRON  DEPOT, 63-MONTH,) 11.25 MG injection Inject 11.25 mg into the muscle every 3 (three) months. (Patient not taking: Reported on 01/31/2024) 1 each 0   megestrol  (MEGACE ) 40 MG tablet Take 2 tablets (80 mg total) by mouth daily. Can increase to two tablets twice a day in the event of heavy bleeding (Patient not taking: Reported on 01/31/2024) 60 tablet 0   Multiple Vitamin (MULTIVITAMIN) tablet Take 1 tablet by mouth daily. (Patient not taking: Reported on 01/31/2024)     Vitamin D , Ergocalciferol , (DRISDOL ) 1.25 MG (50000 UNIT) CAPS capsule Take  50,000 Units by mouth once a week. (Patient not taking: Reported on 01/31/2024)     No facility-administered medications prior to visit.   Allergies[1]  ROS: A complete ROS was performed with pertinent positives/negatives noted in the HPI. The remainder of the ROS are negative.   Objective:   Today's Vitals   01/31/24 1012 01/31/24 1046  BP: (!) 168/81 134/89  Pulse: 92   SpO2: 100%   Weight: 208 lb 6.4 oz (94.5 kg)   Height: 6' (1.829 m)     GENERAL: Well-appearing, in NAD. Well nourished. SKIN: Pale, warm and dry. No rash, lesion, ulceration, or ecchymoses.  RESPIRATORY: Chest wall symmetrical. Respirations even and non-labored. Breath sounds clear to auscultation bilaterally.  CARDIAC: S1, S2 present, regular rate and rhythm without murmur or gallops. Peripheral pulses 2+ bilaterally.  PSYCH/MENTAL STATUS: Alert, oriented x 3. Cooperative, appropriate mood and affect.    Health Maintenance Due  Topic Date Due   Hepatitis B Vaccines 19-59 Average Risk (1 of 3 - 19+ 3-dose series) Never done   Mammogram  10/08/2016   COVID-19 Vaccine (3 - Moderna risk series) 03/26/2019    No results found for any visits on 01/31/24.     Assessment & Plan:  1. Encounter to establish care with new doctor (Primary) Discussed role of NP and expectations of the Primary Care Clinic.  Discussed medical, surgical, and family history.   2. Screening for colon cancer Referral placed today. Discussed red flag symptoms and when to be seen in ER or by PCP. Pt verbalized understanding. - Ambulatory referral to Gastroenterology  3. Breast cancer screening by mammogram Order placed today. - MM 3D SCREENING MAMMOGRAM BILATERAL BREAST; Future    Patient to reach out to office if new, worrisome, or unresolved symptoms arise or if no improvement in patient's condition. Patient verbalized understanding and is agreeable to treatment plan. All questions answered to patient's satisfaction.    Return in about 1 month (around 03/04/2024) for annual physical (fasting labs day of).    Lauraine Almarie Angus DNP, FNP-C      [1]  Allergies Allergen Reactions   Amoxicillin Other (See Comments)    Caused bad yeast infection   "

## 2024-01-31 NOTE — Addendum Note (Signed)
 Addended by: Daryus Sowash ELIZABETH on: 01/31/2024 11:44 AM   Modules accepted: Orders

## 2024-01-31 NOTE — Progress Notes (Signed)
 GYN US : TA and TV imaging performed - vinyl probe cover used - Chaperone: Emma Grossly enlarged midplane uterus, heterogeneous myometrium with numerous intramural fibroids: 55 x 54 mm, 46 x 30 mm, 34 x 27 mm, 38 x 31 mm, 60x 41 mm are largest fibroids.  Endometrial cavity not defined due to multi-fibroid uterus. Ovaries appear normal in size,  The ovaries appear mobile, normal blood flow to both ovaries, neg adnexal regions,  neg CDS, no free fluid present

## 2024-02-05 ENCOUNTER — Other Ambulatory Visit: Payer: Self-pay | Admitting: Obstetrics & Gynecology

## 2024-02-05 ENCOUNTER — Encounter (HOSPITAL_COMMUNITY)
Admission: RE | Admit: 2024-02-05 | Discharge: 2024-02-05 | Disposition: A | Source: Ambulatory Visit | Attending: Obstetrics & Gynecology

## 2024-02-05 ENCOUNTER — Encounter (HOSPITAL_COMMUNITY): Payer: Self-pay

## 2024-02-05 DIAGNOSIS — Z01818 Encounter for other preprocedural examination: Secondary | ICD-10-CM

## 2024-02-05 DIAGNOSIS — Z01812 Encounter for preprocedural laboratory examination: Secondary | ICD-10-CM | POA: Insufficient documentation

## 2024-02-05 HISTORY — DX: Other complications of anesthesia, initial encounter: T88.59XA

## 2024-02-05 LAB — COMPREHENSIVE METABOLIC PANEL WITH GFR
ALT: 9 U/L (ref 0–44)
AST: 16 U/L (ref 15–41)
Albumin: 4.5 g/dL (ref 3.5–5.0)
Alkaline Phosphatase: 106 U/L (ref 38–126)
Anion gap: 16 — ABNORMAL HIGH (ref 5–15)
BUN: 14 mg/dL (ref 6–20)
CO2: 23 mmol/L (ref 22–32)
Calcium: 9.7 mg/dL (ref 8.9–10.3)
Chloride: 104 mmol/L (ref 98–111)
Creatinine, Ser: 0.97 mg/dL (ref 0.44–1.00)
GFR, Estimated: >60 mL/min
Glucose, Bld: 88 mg/dL (ref 70–99)
Potassium: 3.3 mmol/L — ABNORMAL LOW (ref 3.5–5.1)
Sodium: 143 mmol/L (ref 135–145)
Total Bilirubin: 0.7 mg/dL (ref 0.0–1.2)
Total Protein: 8.4 g/dL — ABNORMAL HIGH (ref 6.5–8.1)

## 2024-02-05 LAB — RAPID HIV SCREEN (HIV 1/2 AB+AG)
HIV 1/2 Antibodies: NONREACTIVE
HIV-1 P24 Antigen - HIV24: NONREACTIVE

## 2024-02-05 LAB — CBC
HCT: 23.3 % — ABNORMAL LOW (ref 36.0–46.0)
Hemoglobin: 6.1 g/dL — CL (ref 12.0–15.0)
MCH: 18 pg — ABNORMAL LOW (ref 26.0–34.0)
MCHC: 26.2 g/dL — ABNORMAL LOW (ref 30.0–36.0)
MCV: 68.9 fL — ABNORMAL LOW (ref 80.0–100.0)
Platelets: 554 10*3/uL — ABNORMAL HIGH (ref 150–400)
RBC: 3.38 MIL/uL — ABNORMAL LOW (ref 3.87–5.11)
RDW: 19.4 % — ABNORMAL HIGH (ref 11.5–15.5)
WBC: 6.1 10*3/uL (ref 4.0–10.5)
nRBC: 0.3 % — ABNORMAL HIGH (ref 0.0–0.2)

## 2024-02-05 LAB — URINALYSIS, ROUTINE W REFLEX MICROSCOPIC
Bilirubin Urine: NEGATIVE
Glucose, UA: NEGATIVE mg/dL
Hgb urine dipstick: NEGATIVE
Ketones, ur: NEGATIVE mg/dL
Nitrite: NEGATIVE
Protein, ur: 30 mg/dL — AB
Specific Gravity, Urine: 1.027 (ref 1.005–1.030)
pH: 5 (ref 5.0–8.0)

## 2024-02-05 LAB — PREPARE RBC (CROSSMATCH)

## 2024-02-05 LAB — PREGNANCY, URINE: Preg Test, Ur: NEGATIVE

## 2024-02-06 ENCOUNTER — Encounter (HOSPITAL_COMMUNITY): Admitting: Anesthesiology

## 2024-02-06 ENCOUNTER — Ambulatory Visit (HOSPITAL_COMMUNITY)
Admission: RE | Admit: 2024-02-06 | Discharge: 2024-02-06 | Disposition: A | Attending: Obstetrics & Gynecology | Admitting: Obstetrics & Gynecology

## 2024-02-06 ENCOUNTER — Encounter (HOSPITAL_COMMUNITY): Admission: RE | Disposition: A | Payer: Self-pay | Source: Home / Self Care | Attending: Obstetrics & Gynecology

## 2024-02-06 ENCOUNTER — Encounter (HOSPITAL_COMMUNITY): Payer: Self-pay | Admitting: Obstetrics & Gynecology

## 2024-02-06 ENCOUNTER — Other Ambulatory Visit: Payer: Self-pay

## 2024-02-06 DIAGNOSIS — N83291 Other ovarian cyst, right side: Secondary | ICD-10-CM | POA: Insufficient documentation

## 2024-02-06 DIAGNOSIS — N921 Excessive and frequent menstruation with irregular cycle: Secondary | ICD-10-CM

## 2024-02-06 DIAGNOSIS — N838 Other noninflammatory disorders of ovary, fallopian tube and broad ligament: Secondary | ICD-10-CM | POA: Insufficient documentation

## 2024-02-06 DIAGNOSIS — N80203 Endometriosis of bilateral fallopian tubes, unspecified depth: Secondary | ICD-10-CM | POA: Insufficient documentation

## 2024-02-06 DIAGNOSIS — N83201 Unspecified ovarian cyst, right side: Secondary | ICD-10-CM | POA: Insufficient documentation

## 2024-02-06 DIAGNOSIS — D219 Benign neoplasm of connective and other soft tissue, unspecified: Secondary | ICD-10-CM | POA: Diagnosis present

## 2024-02-06 DIAGNOSIS — D251 Intramural leiomyoma of uterus: Secondary | ICD-10-CM | POA: Insufficient documentation

## 2024-02-06 DIAGNOSIS — N80103 Endometriosis of bilateral ovaries, unspecified depth: Secondary | ICD-10-CM | POA: Insufficient documentation

## 2024-02-06 DIAGNOSIS — N938 Other specified abnormal uterine and vaginal bleeding: Secondary | ICD-10-CM

## 2024-02-06 DIAGNOSIS — I10 Essential (primary) hypertension: Secondary | ICD-10-CM | POA: Insufficient documentation

## 2024-02-06 DIAGNOSIS — N83292 Other ovarian cyst, left side: Secondary | ICD-10-CM | POA: Insufficient documentation

## 2024-02-06 DIAGNOSIS — D500 Iron deficiency anemia secondary to blood loss (chronic): Secondary | ICD-10-CM | POA: Insufficient documentation

## 2024-02-06 DIAGNOSIS — N83202 Unspecified ovarian cyst, left side: Secondary | ICD-10-CM | POA: Insufficient documentation

## 2024-02-06 LAB — POCT I-STAT, CHEM 8
BUN: 11 mg/dL (ref 6–20)
BUN: 11 mg/dL (ref 6–20)
Calcium, Ion: 1.08 mmol/L — ABNORMAL LOW (ref 1.15–1.40)
Calcium, Ion: 0.96 mmol/L — ABNORMAL LOW (ref 1.15–1.40)
Chloride: 106 mmol/L (ref 98–111)
Chloride: 110 mmol/L (ref 98–111)
Creatinine, Ser: 1.1 mg/dL — ABNORMAL HIGH (ref 0.44–1.00)
Creatinine, Ser: 1 mg/dL (ref 0.44–1.00)
Glucose, Bld: 130 mg/dL — ABNORMAL HIGH (ref 70–99)
Glucose, Bld: 101 mg/dL — ABNORMAL HIGH (ref 70–99)
HCT: 23 % — ABNORMAL LOW (ref 36.0–46.0)
HCT: 34 % — ABNORMAL LOW (ref 36.0–46.0)
Hemoglobin: 7.8 g/dL — ABNORMAL LOW (ref 12.0–15.0)
Hemoglobin: 11.6 g/dL — ABNORMAL LOW (ref 12.0–15.0)
Potassium: 3.2 mmol/L — ABNORMAL LOW (ref 3.5–5.1)
Potassium: 3.9 mmol/L (ref 3.5–5.1)
Sodium: 142 mmol/L (ref 135–145)
Sodium: 141 mmol/L (ref 135–145)
TCO2: 25 mmol/L (ref 22–32)
TCO2: 21 mmol/L — ABNORMAL LOW (ref 22–32)

## 2024-02-06 LAB — PREPARE RBC (CROSSMATCH)

## 2024-02-06 MED ORDER — 0.9 % SODIUM CHLORIDE (POUR BTL) OPTIME
TOPICAL | Status: DC | PRN
  Administered 2024-02-06: 500 mL

## 2024-02-06 MED ORDER — MIDAZOLAM HCL (PF) 2 MG/2ML IJ SOLN
INTRAMUSCULAR | Status: DC | PRN
  Administered 2024-02-06: 2 mg via INTRAVENOUS

## 2024-02-06 MED ORDER — GLYCOPYRROLATE PF 0.2 MG/ML IJ SOSY
PREFILLED_SYRINGE | INTRAMUSCULAR | Status: DC | PRN
  Administered 2024-02-06: .2 mg via INTRAVENOUS

## 2024-02-06 MED ORDER — MIDAZOLAM HCL 2 MG/2ML IJ SOLN
INTRAMUSCULAR | Status: AC
  Filled 2024-02-06: qty 2

## 2024-02-06 MED ORDER — OXYCODONE HCL 5 MG PO TABS: 5.0000 mg | ORAL_TABLET | Freq: Once | ORAL | Status: DC | PRN

## 2024-02-06 MED ORDER — ONDANSETRON HCL 4 MG/2ML IJ SOLN
INTRAMUSCULAR | Status: DC | PRN
  Administered 2024-02-06: 4 mg via INTRAVENOUS

## 2024-02-06 MED ORDER — KETAMINE HCL 50 MG/5ML IJ SOSY
PREFILLED_SYRINGE | INTRAMUSCULAR | Status: DC | PRN
  Administered 2024-02-06: 50 mg via INTRAVENOUS

## 2024-02-06 MED ORDER — KETOROLAC TROMETHAMINE 30 MG/ML IJ SOLN
30.0000 mg | INTRAMUSCULAR | Status: AC
  Administered 2024-02-06: 30 mg via INTRAVENOUS
  Filled 2024-02-06: qty 1

## 2024-02-06 MED ORDER — ROCURONIUM BROMIDE 10 MG/ML (PF) SYRINGE
PREFILLED_SYRINGE | INTRAVENOUS | Status: AC
  Filled 2024-02-06: qty 10

## 2024-02-06 MED ORDER — ONDANSETRON HCL 4 MG/2ML IJ SOLN: 4.0000 mg | Freq: Once | INTRAMUSCULAR | Status: DC | PRN

## 2024-02-06 MED ORDER — ONDANSETRON HCL 4 MG/2ML IJ SOLN
INTRAMUSCULAR | Status: AC
  Filled 2024-02-06: qty 2

## 2024-02-06 MED ORDER — LIDOCAINE 2% (20 MG/ML) 5 ML SYRINGE
INTRAMUSCULAR | Status: DC | PRN
  Administered 2024-02-06: 100 mg via INTRAVENOUS

## 2024-02-06 MED ORDER — ORAL CARE MOUTH RINSE: 15.0000 mL | Freq: Once | OROMUCOSAL | Status: AC

## 2024-02-06 MED ORDER — PROPOFOL 10 MG/ML IV BOLUS
INTRAVENOUS | Status: DC | PRN
  Administered 2024-02-06: 120 mg via INTRAVENOUS

## 2024-02-06 MED ORDER — FENTANYL CITRATE (PF) 250 MCG/5ML IJ SOLN
INTRAMUSCULAR | Status: AC
  Filled 2024-02-06: qty 5

## 2024-02-06 MED ORDER — KETOROLAC TROMETHAMINE 10 MG PO TABS: 10.0000 mg | ORAL_TABLET | Freq: Three times a day (TID) | ORAL | 0 refills | Status: AC | PRN

## 2024-02-06 MED ORDER — KETAMINE HCL 50 MG/5ML IJ SOSY
PREFILLED_SYRINGE | INTRAMUSCULAR | Status: AC
  Filled 2024-02-06: qty 5

## 2024-02-06 MED ORDER — DEXMEDETOMIDINE HCL IN NACL 80 MCG/20ML IV SOLN
INTRAVENOUS | Status: DC | PRN
  Administered 2024-02-06: 8 ug via INTRAVENOUS
  Administered 2024-02-06: 12 ug via INTRAVENOUS

## 2024-02-06 MED ORDER — SUGAMMADEX SODIUM 200 MG/2ML IV SOLN
INTRAVENOUS | Status: DC | PRN
  Administered 2024-02-06: 200 mg via INTRAVENOUS

## 2024-02-06 MED ORDER — OXYCODONE-ACETAMINOPHEN 7.5-325 MG PO TABS: 1.0000 | ORAL_TABLET | Freq: Four times a day (QID) | ORAL | 0 refills | Status: AC | PRN

## 2024-02-06 MED ORDER — FENTANYL CITRATE (PF) 250 MCG/5ML IJ SOLN
INTRAMUSCULAR | Status: DC | PRN
  Administered 2024-02-06 (×3): 50 ug via INTRAVENOUS
  Administered 2024-02-06: 100 ug via INTRAVENOUS

## 2024-02-06 MED ORDER — LACTATED RINGERS IV SOLN: INTRAVENOUS | Status: DC

## 2024-02-06 MED ORDER — SODIUM CHLORIDE 0.9 % IV SOLN
10.0000 mL/h | Freq: Once | INTRAVENOUS | Status: AC
  Administered 2024-02-06: 10 mL/h via INTRAVENOUS

## 2024-02-06 MED ORDER — BUPIVACAINE HCL (PF) 0.25 % IJ SOLN
INTRAMUSCULAR | Status: AC
  Filled 2024-02-06: qty 60

## 2024-02-06 MED ORDER — TRANEXAMIC ACID-NACL 1000-0.7 MG/100ML-% IV SOLN
1000.0000 mg | Freq: Once | INTRAVENOUS | Status: AC
  Administered 2024-02-06: 1000 mg via INTRAVENOUS
  Filled 2024-02-06: qty 100

## 2024-02-06 MED ORDER — SODIUM CHLORIDE 0.9% IV SOLUTION
Freq: Once | INTRAVENOUS | Status: AC
  Administered 2024-02-06 (×2): 500 mL via INTRAVENOUS

## 2024-02-06 MED ORDER — DEXAMETHASONE SOD PHOSPHATE PF 10 MG/ML IJ SOLN
INTRAMUSCULAR | Status: AC
  Filled 2024-02-06: qty 1

## 2024-02-06 MED ORDER — OXYCODONE HCL 5 MG/5ML PO SOLN: 5.0000 mg | Freq: Once | ORAL | Status: DC | PRN

## 2024-02-06 MED ORDER — ESMOLOL HCL 100 MG/10ML IV SOLN
INTRAVENOUS | Status: AC
  Filled 2024-02-06: qty 10

## 2024-02-06 MED ORDER — DEXMEDETOMIDINE HCL IN NACL 80 MCG/20ML IV SOLN
INTRAVENOUS | Status: AC
  Filled 2024-02-06: qty 20

## 2024-02-06 MED ORDER — FENTANYL CITRATE (PF) 50 MCG/ML IJ SOSY
25.0000 ug | PREFILLED_SYRINGE | INTRAMUSCULAR | Status: DC | PRN
  Administered 2024-02-06 (×3): 50 ug via INTRAVENOUS
  Filled 2024-02-06 (×3): qty 1

## 2024-02-06 MED ORDER — STERILE WATER FOR IRRIGATION IR SOLN
Status: DC | PRN
  Administered 2024-02-06: 500 mL

## 2024-02-06 MED ORDER — ONDANSETRON 8 MG PO TBDP: 8.0000 mg | ORAL_TABLET | Freq: Three times a day (TID) | ORAL | 0 refills | Status: AC | PRN

## 2024-02-06 MED ORDER — CHLORHEXIDINE GLUCONATE 0.12 % MT SOLN
15.0000 mL | Freq: Once | OROMUCOSAL | Status: AC
  Administered 2024-02-06: 15 mL via OROMUCOSAL

## 2024-02-06 MED ORDER — ESMOLOL HCL 100 MG/10ML IV SOLN
INTRAVENOUS | Status: DC | PRN
  Administered 2024-02-06: 20 mg via INTRAVENOUS

## 2024-02-06 MED ORDER — PROPOFOL 10 MG/ML IV BOLUS
INTRAVENOUS | Status: AC
  Filled 2024-02-06: qty 20

## 2024-02-06 MED ORDER — SODIUM CHLORIDE 0.9 % IR SOLN
Status: DC | PRN
  Administered 2024-02-06: 3000 mL

## 2024-02-06 MED ORDER — ROCURONIUM BROMIDE 10 MG/ML (PF) SYRINGE
PREFILLED_SYRINGE | INTRAVENOUS | Status: DC | PRN
  Administered 2024-02-06 (×2): 20 mg via INTRAVENOUS
  Administered 2024-02-06: 80 mg via INTRAVENOUS

## 2024-02-06 MED ORDER — POVIDONE-IODINE 10 % EX SWAB
2.0000 | Freq: Once | CUTANEOUS | Status: AC
  Administered 2024-02-06: 2 via TOPICAL

## 2024-02-06 MED ORDER — DEXAMETHASONE SOD PHOSPHATE PF 10 MG/ML IJ SOLN
INTRAMUSCULAR | Status: DC | PRN
  Administered 2024-02-06: 10 mg via INTRAVENOUS

## 2024-02-06 MED ORDER — GLYCOPYRROLATE PF 0.2 MG/ML IJ SOSY
PREFILLED_SYRINGE | INTRAMUSCULAR | Status: AC
  Filled 2024-02-06: qty 1

## 2024-02-06 MED ORDER — CIPROFLOXACIN HCL 500 MG PO TABS: 500.0000 mg | ORAL_TABLET | Freq: Two times a day (BID) | ORAL | 0 refills | Status: AC

## 2024-02-06 MED ORDER — BUPIVACAINE HCL 0.25 % IJ SOLN
INTRAMUSCULAR | Status: DC | PRN
  Administered 2024-02-06: 40 mL

## 2024-02-06 MED ORDER — CEFAZOLIN SODIUM-DEXTROSE 2-4 GM/100ML-% IV SOLN
2.0000 g | INTRAVENOUS | Status: AC
  Administered 2024-02-06: 2 g via INTRAVENOUS
  Filled 2024-02-06: qty 100

## 2024-02-06 NOTE — Anesthesia Procedure Notes (Signed)
 Procedure Name: Intubation Date/Time: 02/06/2024 10:29 AM  Performed by: Lynnetta Darrel Lebron Mickey., CRNAPre-anesthesia Checklist: Patient identified, Emergency Drugs available, Suction available and Patient being monitored Patient Re-evaluated:Patient Re-evaluated prior to induction Oxygen Delivery Method: Circle system utilized Preoxygenation: Pre-oxygenation with 100% oxygen Induction Type: IV induction Ventilation: Mask ventilation without difficulty Laryngoscope Size: Mac and 3 Grade View: Grade II Tube type: Oral Tube size: 7.0 mm Number of attempts: 1 Airway Equipment and Method: Stylet Placement Confirmation: ETT inserted through vocal cords under direct vision, positive ETCO2 and breath sounds checked- equal and bilateral Secured at: 23 cm Tube secured with: Tape Dental Injury: Teeth and Oropharynx as per pre-operative assessment

## 2024-02-06 NOTE — Anesthesia Preprocedure Evaluation (Signed)
"                                    Anesthesia Evaluation  Patient identified by MRN, date of birth, ID band Patient awake    Reviewed: Allergy & Precautions, H&P , NPO status , Patient's Chart, lab work & pertinent test results, reviewed documented beta blocker date and time   History of Anesthesia Complications (+) history of anesthetic complications  Airway Mallampati: II  TM Distance: >3 FB Neck ROM: full    Dental no notable dental hx.    Pulmonary neg pulmonary ROS   Pulmonary exam normal breath sounds clear to auscultation       Cardiovascular Exercise Tolerance: Good hypertension, negative cardio ROS  Rhythm:regular Rate:Normal     Neuro/Psych negative neurological ROS  negative psych ROS   GI/Hepatic Neg liver ROS, hiatal hernia,,,  Endo/Other    Class 4 obesity  Renal/GU negative Renal ROS  negative genitourinary   Musculoskeletal   Abdominal   Peds  Hematology  (+) Blood dyscrasia, anemia   Anesthesia Other Findings Transfuse 1 unit PRBC pre-op, second unit hanging on induction.  Stay 2 units ahead.  Reproductive/Obstetrics negative OB ROS                              Anesthesia Physical Anesthesia Plan  ASA: 3  Anesthesia Plan: General and General ETT   Post-op Pain Management:    Induction:   PONV Risk Score and Plan: Ondansetron  and Scopolamine patch - Pre-op  Airway Management Planned:   Additional Equipment:   Intra-op Plan:   Post-operative Plan:   Informed Consent: I have reviewed the patients History and Physical, chart, labs and discussed the procedure including the risks, benefits and alternatives for the proposed anesthesia with the patient or authorized representative who has indicated his/her understanding and acceptance.     Dental Advisory Given  Plan Discussed with: CRNA  Anesthesia Plan Comments: (Transfuse 1 unit PRBC pre-op, second unit hanging on induction.  Stay 2 units  ahead.)        Anesthesia Quick Evaluation  "

## 2024-02-06 NOTE — Transfer of Care (Signed)
 Immediate Anesthesia Transfer of Care Note  Patient: Sheena Wells  Procedure(s) Performed: HYSTERECTOMY, TOTAL, BILATERAL SAPLINGO-OOPHORECTOMY, ROBOT ASSISTED, LAPAROSCOPIC, D5 (Abdomen)  Patient Location: PACU  Anesthesia Type:General  Level of Consciousness: drowsy  Airway & Oxygen Therapy: Patient Spontanous Breathing and Patient connected to face mask oxygen  Post-op Assessment: Report given to RN and Post -op Vital signs reviewed and stable  Post vital signs: Reviewed and stable  Last Vitals:  Vitals Value Taken Time  BP 141/91 02/06/24 13:24  Temp    Pulse 78 02/06/24 13:28  Resp 14 02/06/24 13:28  SpO2 100 % 02/06/24 13:28  Vitals shown include unfiled device data.  Last Pain:  Vitals:   02/06/24 0916  TempSrc: Oral  PainSc:       Patients Stated Pain Goal: 5 (02/06/24 9271)  Complications: There were no known notable events for this encounter.

## 2024-02-06 NOTE — Discharge Instructions (Signed)
 Sheena Wells

## 2024-02-06 NOTE — Progress Notes (Signed)
 Dr Jayne notified of Istat results. No new orders received.

## 2024-02-06 NOTE — H&P (Signed)
 " Preoperative History and Physical  Sheena Wells is a 49 y.o. [id number] with Patient's last menstrual period was 01/28/2024 (approximate). admitted for a RA TLH BSO.  Long history of menometrorrhagia and resulting anemia, known fibroids for a decade Last fall size 40 cm with tape measure, given Lupron  now 28 cm but mobile Multiple fibroids Sonogram measure 1200cc but the uterus is significantly larger than that on exam  Hemoglobin 6.1-->2 units to be given this am pre- peri- operativly and 2 more available if needed    PMH:    Past Medical History:  Diagnosis Date   Anemia    Cholecystitis 06/05/2011   Complication of anesthesia    faoming at the mouth after propofol  for colonoscopy in [place]   H/O hiatal hernia    Hernia     PSH:     Past Surgical History:  Procedure Laterality Date   BREAST SURGERY  2002   mass removed from both breasts   CHOLECYSTECTOMY  06/06/2011   Procedure: LAPAROSCOPIC CHOLECYSTECTOMY WITH INTRAOPERATIVE CHOLANGIOGRAM;  Surgeon: Elon CHRISTELLA Pacini, MD;  Location: [facility] OR;  Service: General;  Laterality: N/A;    POb/GynH:      OB History     Gravida  3   Para  1   Term  1   Preterm      AB  2   Living  1      SAB  2   IAB      Ectopic      Multiple      Live Births  1           SH:  Social History[1]  FH:    Family History  Problem Relation Age of Onset   Hypertension Mother    Diabetes Mother    Stroke Maternal Grandmother      Allergies: Allergies[2]  Medications:      Current Medications[3]  Review of Systems:   Review of Systems  Constitutional: Negative for fever, chills, weight loss, malaise/fatigue and diaphoresis.  HENT: Negative for hearing loss, ear pain, nosebleeds, congestion, sore throat, neck pain, tinnitus and ear discharge.   Eyes: Negative for blurred vision, double vision, photophobia, pain, discharge and redness.  Respiratory: Negative for cough, hemoptysis, sputum production, shortness  of breath, wheezing and stridor.   Cardiovascular: Negative for chest pain, palpitations, orthopnea, claudication, leg swelling and PND.  Gastrointestinal: Positive for abdominal pain. Negative for heartburn, nausea, vomiting, diarrhea, constipation, blood in stool and melena.  Genitourinary: Negative for dysuria, urgency, frequency, hematuria and flank pain.  Musculoskeletal: Negative for myalgias, back pain, joint pain and falls.  Skin: Negative for itching and rash.  Neurological: Negative for dizziness, tingling, tremors, sensory change, speech change, focal weakness, seizures, loss of consciousness, weakness and headaches.  Endo/Heme/Allergies: Negative for environmental allergies and polydipsia. Does not bruise/bleed easily.  Psychiatric/Behavioral: Negative for depression, suicidal ideas, hallucinations, memory loss and substance abuse. The patient is not nervous/anxious and does not have insomnia.      PHYSICAL EXAM:  Blood pressure 138/84, pulse 80, temperature 98.5 F (36.9 C), resp. rate 16, height 6' (1.829 m), weight 94.5 kg, last menstrual period 01/28/2024, SpO2 100%.    Vitals reviewed. Constitutional: She is oriented to person, place, and time. She appears well-developed and well-nourished.  HENT:  Head: Normocephalic and atraumatic.  Right Ear: External ear normal.  Left Ear: External ear normal.  Nose: Nose normal.  Mouth/Throat: Oropharynx is clear and moist.  Eyes: Conjunctivae and EOM are  normal. Pupils are equal, round, and reactive to light. Right eye exhibits no discharge. Left eye exhibits no discharge. No scleral icterus.  Neck: Normal range of motion. Neck supple. No tracheal deviation present. No thyromegaly present.  Cardiovascular: Normal rate, regular rhythm, normal heart sounds and intact distal pulses.  Exam reveals no gallop and no friction rub.   No murmur heard. Respiratory: Effort normal and breath sounds normal. No respiratory distress. She has no  wheezes. She has no rales. She exhibits no tenderness.  GI: Soft. Bowel sounds are normal. She exhibits no distension and no mass. There is tenderness. There is no rebound and no guarding.  Genitourinary:       Vulva is normal without lesions Vagina is pink moist without discharge Cervix normal in appearance and pap is normal Uterus is 28 weeks size moblie multiple fibroids palpable Adnexa is negative with normal sized ovaries by sonogram  Musculoskeletal: Normal range of motion. She exhibits no edema and no tenderness.  Neurological: She is alert and oriented to person, place, and time. She has normal reflexes. She displays normal reflexes. No cranial nerve deficit. She exhibits normal muscle tone. Coordination normal.  Skin: Skin is warm and dry. No rash noted. No erythema. No pallor.  Psychiatric: She has a normal mood and affect. Her behavior is normal. Judgment and thought content normal.    Labs: Results for orders placed or performed during the hospital encounter of 02/06/24 (from the past 2 weeks)  Prepare RBC (crossmatch)   Collection Time: 02/06/24  7:30 AM  Result Value Ref Range   Order Confirmation      ORDER PROCESSED BY BLOOD BANK Performed at [facility], [address] [place], [place] [address]   Results for orders placed or performed during the hospital encounter of 02/05/24 (from the past 2 weeks)  Prepare RBC (crossmatch)   Collection Time: 02/05/24  3:48 PM  Result Value Ref Range   Order Confirmation      ORDER PROCESSED BY BLOOD BANK Performed at [facility], [address], [place], [place] [address]   CBC   Collection Time: 02/05/24  3:49 PM  Result Value Ref Range   WBC 6.1 4.0 - 10.5 K/uL   RBC 3.38 (L) 3.87 - 5.11 MIL/uL   Hemoglobin 6.1 (LL) 12.0 - 15.0 g/dL   HCT 76.6 (L) 63.9 - 53.9 %   MCV 68.9 (L) 80.0 - 100.0 fL   MCH 18.0 (L) 26.0 - 34.0 pg   MCHC 26.2 (L) 30.0 - 36.0 g/dL   RDW 80.5 (H) 88.4 - 84.4 %   Platelets 554 (H) 150 - 400 K/uL    nRBC 0.3 (H) 0.0 - 0.2 %  Comprehensive metabolic panel   Collection Time: 02/05/24  3:49 PM  Result Value Ref Range   Sodium 143 135 - 145 mmol/L   Potassium 3.3 (L) 3.5 - 5.1 mmol/L   Chloride 104 98 - 111 mmol/L   CO2 23 22 - 32 mmol/L   Glucose, Bld 88 70 - 99 mg/dL   BUN 14 6 - 20 mg/dL   Creatinine, Ser 9.02 0.44 - 1.00 mg/dL   Calcium 9.7 8.9 - 89.6 mg/dL   Total Protein 8.4 (H) 6.5 - 8.1 g/dL   Albumin 4.5 3.5 - 5.0 g/dL   AST 16 15 - 41 U/L   ALT 9 0 - 44 U/L   Alkaline Phosphatase 106 38 - 126 U/L   Total Bilirubin 0.7 0.0 - 1.2 mg/dL  GFR, Estimated >60 >60 mL/min   Anion gap 16 (H) 5 - 15  Rapid HIV screen (HIV 1/2 Ab+Ag)   Collection Time: 02/05/24  3:49 PM  Result Value Ref Range   HIV-1 P24 Antigen - HIV24 NON REACTIVE NON REACTIVE   HIV 1/2 Antibodies NON REACTIVE NON REACTIVE   Interpretation (HIV Ag Ab)      A non reactive test result means that HIV 1 or HIV 2 antibodies and HIV 1 p24 antigen were not detected in the specimen.  Type and screen   Collection Time: 02/05/24  3:49 PM  Result Value Ref Range   ABO/RH(D) O POS    Antibody Screen NEG    Sample Expiration 02/19/2024,2359    Extend sample reason NO TRANSFUSIONS OR PREGNANCY IN THE PAST 3 MONTHS    Unit Number [id number]    Blood Component Type RED CELLS,LR    Unit division 00    Status of Unit ISSUED    Transfusion Status OK TO TRANSFUSE    Crossmatch Result      Compatible Performed at [facility], [address] [place], [place] [address]    Unit Number [id number]    Blood Component Type RED CELLS,LR    Unit division 00    Status of Unit ALLOCATED    Transfusion Status OK TO TRANSFUSE    Crossmatch Result Compatible   BPAM RBC   Collection Time: 02/05/24  3:49 PM  Result Value Ref Range   ISSUE DATE / TIME [id number]    Blood Product Unit Number [id number]    PRODUCT CODE Z9617C99    Unit Type and Rh 5100    Blood Product Expiration Date 797397767640    Blood Product  Unit Number [id number]    PRODUCT CODE Z9617C99    Unit Type and Rh 5100    Blood Product Expiration Date 797397767640   Urinalysis, Routine w reflex microscopic -Urine, Clean Catch   Collection Time: 02/05/24  4:06 PM  Result Value Ref Range   Color, Urine YELLOW YELLOW   APPearance TURBID (A) CLEAR   Specific Gravity, Urine 1.027 1.005 - 1.030   pH 5.0 5.0 - 8.0   Glucose, UA NEGATIVE NEGATIVE mg/dL   Hgb urine dipstick NEGATIVE NEGATIVE   Bilirubin Urine NEGATIVE NEGATIVE   Ketones, ur NEGATIVE NEGATIVE mg/dL   Protein, ur 30 (A) NEGATIVE mg/dL   Nitrite NEGATIVE NEGATIVE   Leukocytes,Ua TRACE (A) NEGATIVE   RBC / HPF 0-5 0 - 5 RBC/hpf   WBC, UA 6-10 0 - 5 WBC/hpf   Bacteria, UA RARE (A) NONE SEEN   Squamous Epithelial / HPF 0-5 0 - 5 /HPF   Amorphous Crystal PRESENT   Pregnancy, urine   Collection Time: 02/05/24  4:06 PM  Result Value Ref Range   Preg Test, Ur NEGATIVE NEGATIVE    EKG: Orders placed or performed during the hospital encounter of 01/29/23   EKG 12-Lead   EKG 12-Lead   ED EKG   ED EKG   EKG   EKG   EKG   EKG   EKG   EKG   EKG   EKG    Imaging Studies: US  PELVIC COMPLETE WITH TRANSVAGINAL Result Date: 01/31/2024 Images from the original result were not included.  ..an [employer] of Ultrasound Medicine TECHNICAL SALES ENGINEER) accredited practice Center for [facility] [address] [place] [address] Ordering Provider: Jayne Vonn DEL, MD  GYNECOLOGIC SONOGRAM EXAM: TRANSABDOMINAL AND TRANSVAGINAL ULTRASOUND OF PELVIS  TECHNIQUE: Transabdominal and transvaginal ultrasound examinations of the pelvis were performed. Transabdominal technique was performed for global imaging of the pelvis including uterus, ovaries, adnexal regions, and pelvic cul-de-sac. It was necessary to proceed with endovaginal exam following the transabdominal exam to better visualize and improve detailed examination of  the  uterus, endometrium and bilateral ovaries, as well as, evaluation of ovarian blood flow and sliding characteristics. Sheena Wells is a 49 y.o. [id number] Patient's last menstrual period was 01/28/2024 (approximate). for a pelvic sonogram for enlarged fibroid uterus. Uterus                      15.8 x 11.4 x 12.8 cm, Total uterine volume 1205 cc Grossly enlarged midplane uterus, heterogeneous myometrium with numerous intramural fibroids: 55 x 54 mm, 46 x 30 mm, 34 x 27 mm, 38 x 31 mm, 60x 41 mm are largest fibroids Endometrium          Endometrial cavity not defined due to multi-fibroid uterus Right ovary             3.1 x 1.9 x 2.6 cm,  mobile Left ovary                3.2 x 2.2 x 3.3 cm, mobile The ovaries appear mobile, normal blood flow to both ovaries, neg adnexal regions, neg CDS, no free fluid present Technician Comments: GYN US : TA and TV imaging performed - vinyl probe cover used - Chaperone: Emma Grossly enlarged midplane uterus, heterogeneous myometrium with numerous intramural fibroids: 55 x 54 mm, 46 x 30 mm, 34 x 27 mm, 38 x 31 mm, 60x 41 mm are largest fibroids.  Endometrial cavity not defined due to multi-fibroid uterus. Cervical canal avascular without evidence for focal soft tissue defects. Ovaries appear normal in size,  The ovaries appear mobile, normal blood flow to both ovaries, neg adnexal regions, neg CDS, no free fluid present Sharlet GORMAN Fair 01/31/2024 4:32 PM  Clinical Impression and recommendations: I have reviewed the sonogram results above, combined with the patient's current clinical course, below are my impressions and any appropriate recommendations for management based on the sonographic findings.  Enlarged irregular shaped uterus with multiple fibroids- uterine volume 1205cc. Several intramural fibroids, largest fibroid measurements are as follows: - 5.5 x 5.4 cm - 4.6 x 3 cm - 3.4 x 2.7 cm - 3.8 x 3.1 mm -  6 x 4.1 cm Endometrium difficult to assess due to uterine fibroids. Normal  ovaries bilaterally. Jennifer M Ozan 01/31/2024 4:42 PM     Assessment: Fibroids, 28 weeks size after Lupron  IDA due to menometrorrhagia, has required multiple transfusions in the past  Plan: RA TLH + BSO, age 51 and blood supply likely stretched and compromised from the fibroids, pt agrees with opportunistic salpingectomy and oophorectomy  Vonn VEAR Inch 02/06/2024 8:49 AM          [1]  Social History Tobacco Use   Smoking status: Never   Smokeless tobacco: Never  Vaping Use   Vaping status: Never Used  Substance Use Topics   Alcohol use: Yes    Alcohol/week: 0.0 standard drinks of alcohol    Comment: occasional   Drug use: No  [2]  Allergies Allergen Reactions   Amoxicillin Other (See Comments)    Caused bad yeast infection  [3]  Current Facility-Administered Medications:    0.9 %  sodium chloride  infusion (Manually program via Guardrails IV  Fluids), , Intravenous, Once, Kamiryn Bezanson, Vonn DEL, MD   ceFAZolin  (ANCEF ) IVPB 2g/100 mL premix, 2 g, Intravenous, On Call to OR, Orvil Faraone, Vonn DEL, MD   lactated ringers  infusion, , Intravenous, Continuous, Kendell, Yvonna PARAS, MD, Last Rate: 10 mL/hr at 02/06/24 0738, New Bag at 02/06/24 0738   tranexamic acid  (CYKLOKAPRON ) IVPB 1,000 mg, 1,000 mg, Intravenous, Once, Jayne Vonn DEL, MD  "

## 2024-02-06 NOTE — Op Note (Signed)
 " Preoperative diagnosis: Fibroid uterus, 28 weeks size menometrorrhagia Iron  deficiency anemia, chronic   Postoperative diagnosis: SAA   Procedure: dV5 Robotic hysterectomy with bilateral salpingo oophorectomy  Laparoscopic guided transversus abdominus plane block for post op pain management  Surgeon: Vonn VEAR Inch, MD   Anesthesia: General endotracheal   Findings: 28 weeks size fibroid uterus, multiple fibroids Tubes ovaries look normal    Description of operation: Patient was taken to the operating room and placed in the low lithotomy position She was prepped and draped in the usual sterile fashion robotic assisted laparoscopic procedure A Foley catheter was placed The vagina was once again prepped additionally   A RUMI II 12 cm with 3.5 cervical cup was placed for uterine manipulation and colpotomy delineation   An incision was made above the umbilicus The umbilical fascia was grasped and incised with scissors Gel port was placed A pneumoperitoneum was created to a pressure of 12   An 8 mm port was placed into the peritoneal cavity using a nonbladed trocar easily with 1 pass using the video laparoscope The peritoneal cavity was confirmed   There were no unusual findings    4 additional 8 mm ports were placed at approximately the same level as the supraumbilical port 3 of the ports were robotic and 1 is an assist port   One was left lateral and 1 was placed right lateral, both lateral to the rectus anterior muscle These were placed under direct visualization without difficulty into the peritoneal cavity Nonbladed trocars were used in all instances    The patient was placed in 18 degrees of Trendelenburg   The [employer] robot was then docked to the 3 robotic ports   Instruments used during the robotic hysterectomy: Vessel sealer extender using bipolar energy ProGrasp forceps with no energy Monopolar hook Megacut needle driver 2-0 PDS symmetrical  STRATAFIX on a CT 2 needle   I then left the patient and went to the surgical console   The RUMI II  was used throughout the case to apply traction and anterior/posterior displacement of the uterus to facilitate the robotic procedure The ProGrasp was used to put traction on the left adnexa The left ureter was identified and found to be well away from the adnexal vessels The vessel sealer extender using bipolar energy was used and the utero-ovarian ligament was coagulated and then transected I used traction medially and anteriorly and used the vessel sealer to take the broad ligament and round ligament down to the level of the cervical isthmus just lateral to the left uterine vessels   I then turned my attention to the right adnexa The pro grasp was used and medial and upward traction was placed The vessel sealer extender using bipolar energy was used to coagulate and ligate the right infundibulopelvic ligament vessels Again the right ureter was well inferior to the vessels I continued use medial and anterior retraction using the ProGrasp and the vessel sealer with the bipolar energy was used to take the broad ligament and round ligament down to the level of the cervical isthmus and lateral to the uterine vessels   I then placed the monopolar hook in the place of the ProGrasp The vessel sealer extender was used to grasp the peritoneum of the bladder provide traction, of course no energy was used for this I used the monopolar hook with energy to open of the peritoneal leaf anteriorly and dissect the bladder peritoneum off the lower uterine segment and cervix beyond  the level of the vaginal colpotomy cup I then used the monopolar hook to take the peritoneum down where I stopped using the vessel sealer and met in the bladder dissection bilaterally   The vessel sealer extender with monopolar energy was used to coagulate the uterine vessels at the level of the cervical isthmus bilaterally and then  just above and just below to manage backbleeding The uterine vessels were transected There was good hemostasis I then stayed medial to this uterine vessel pedicle with the vessel sealer extender and took down the paracervical tissue to allow colpotomy incision using the monopolar hook, preserving the cardinal ligament Again there was good hemostasis bilaterally   I then used the monopolar hook and made a posterior colpotomy incision above the level of insertion of the uterosacral ligaments I used a frowny face then smiley face technique and opened the vagina to approximately 8:00 and 4:00 posteriorly I then used the vessel sealer extender with bipolar energy, coagulating and then transecting the vagina   The monopolar hook was used to make the anterior colpotomy incision as the bladder had been dissected well past the colpotomy ring Cephalad tension was placed on the RUMI II handle throughout this portion of the procedure to prevent thermal injury to the bladder and safe distance from the ureters bilaterally Colpotomy incision was made from 10:00 to 2:00 The vessel sealer with bipolar energy was then used to coagulate and transect the vagina, again inside of the cardinal ligament   The uterus was removed through the abdomen   The tubes and ovaries were  removed through the vagina A wet lap tape was placed in the vagina to maintain pneumoperitoneum   All pedicles were found to be hemostatic there was very little blood loss I then removed the vessel sealer and monopolar hook The pro grasp was replaced and the needle driver was also placed along with the suture   The vagina was closed using the 2-0 PDS STRATAFIX symmetrical suture on the CT 2 needle I pay close attention to the vaginal corners bilaterally and incorporated those in the closure 1.5 cm of vagina was operating to the vaginal closure I also fixed the uterosacral ligaments to the vaginal cuff repair shortening the uterosacral  ligaments thus providing improved vaginal vault suspension The cardinal ligaments were intact again improving postoperative vaginal support Pubocervical rectovaginal and posterior peritoneum were all included in the vaginal closure There was good result hemostasis   At the end of the procedure all the pedicles were identified and found to be hemostatic Both ureters were identified and undergoing normal peristalsis, they were well out of the way of surgical field throughout   I then got up from the console and went back to the side of the patient after gowning and gloving sterilely  I placed a  transversus abdominus plane block was placed using laparoscopic guidance at T10 and T7 bilaterally, 10 cc of 0.25% bupivacaine  at each site, 40 cc total of 100 mg of bupivacaine .  Doyle's bubble technique was used. This was placed for post operative pain management.  ExCITE technique was used to remove the specimen through the extended supraumbilical incision.  Total time of 30 minutes was required for complete tissue extraction.    The 5 robotic ports were removed The insufflation ports were used to actively desufflate the peritoneal cavity to a pressure of 0 Ports were then removed  The supraumbilical fascia was closed   The subcutaneous tissue of all 5 incisions was closed  using 0 Vicryl All 5 skin incisions were closed using 3-0 vicryl in a subcuticular manner Dermabond was used all 5 incision sites  Each incision was dressed   The patient tolerated the procedure well She received 2 g of Ancef  and 30 mg of Toradol  preoperatively   EBL: 50 cc   Vonn VEAR Inch, MD 02/06/2024 1:28 PM       "

## 2024-02-06 NOTE — Anesthesia Procedure Notes (Signed)
 Arterial Line Insertion Start/End2/04/2024 9:40 AM, 02/06/2024 9:45 AM Performed by: Kendell Yvonna PARAS, MD, Elaine Delon CROME, CRNA, CRNA  Patient location: OR. Preanesthetic checklist: patient identified, IV checked, site marked, risks and benefits discussed, surgical consent, monitors and equipment checked, pre-op evaluation, timeout performed and anesthesia consent Right, radial was placed Catheter size: 20 G Hand hygiene performed , maximum sterile barriers used  and Seldinger technique used Allen's test indicative of satisfactory collateral circulation Attempts: 1 Following insertion, dressing applied and Biopatch. Post procedure assessment: normal and unchanged  Patient tolerated the procedure well with no immediate complications.

## 2024-02-07 ENCOUNTER — Encounter (HOSPITAL_COMMUNITY): Payer: Self-pay | Admitting: Obstetrics & Gynecology

## 2024-02-07 LAB — TYPE AND SCREEN
ABO/RH(D): O POS
Antibody Screen: NEGATIVE
Unit division: 0
Unit division: 0
Unit division: 0
Unit division: 0
Unit division: 0
Unit division: 0

## 2024-02-07 LAB — BPAM RBC
Blood Product Expiration Date: 202602192359
Blood Product Expiration Date: 202602232359
Blood Product Expiration Date: 202602232359
Blood Product Expiration Date: 202602232359
Blood Product Expiration Date: 202602232359
Blood Product Unit Number: 202602232359
ISSUE DATE / TIME: 202602040741
PRODUCT CODE: 202602040857
PRODUCT CODE: 202602232359
Unit Type and Rh: 5100
Unit Type and Rh: 5100
Unit Type and Rh: 5100
Unit Type and Rh: 5100
Unit Type and Rh: 5100
Unit Type and Rh: 202602232359
Unit Type and Rh: 5100
Unit Type and Rh: 5100

## 2024-02-07 LAB — SURGICAL PATHOLOGY

## 2024-02-07 NOTE — Anesthesia Postprocedure Evaluation (Signed)
"   Anesthesia Post Note  Patient: Sheena Wells  Procedure(s) Performed: HYSTERECTOMY, TOTAL, BILATERAL SAPLINGO-OOPHORECTOMY, ROBOT ASSISTED, LAPAROSCOPIC, D5 (Abdomen)  Patient location during evaluation: Phase II Anesthesia Type: General Level of consciousness: awake Pain management: pain level controlled Vital Signs Assessment: post-procedure vital signs reviewed and stable Respiratory status: spontaneous breathing and respiratory function stable Cardiovascular status: blood pressure returned to baseline and stable Postop Assessment: no headache and no apparent nausea or vomiting Anesthetic complications: no Comments: Late entry   There were no known notable events for this encounter.   Last Vitals:  Vitals:   02/06/24 1515 02/06/24 1538  BP: 132/81 (!) 164/95  Pulse: 75 90  Resp: 14 12  Temp: 36.8 C 36.7 C  SpO2: 95% 100%    Last Pain:  Vitals:   02/06/24 1538  TempSrc: Oral  PainSc: 2                  Yvonna PARAS [name]      "

## 2024-02-15 ENCOUNTER — Encounter: Admitting: Obstetrics & Gynecology

## 2024-02-15 ENCOUNTER — Other Ambulatory Visit

## 2024-03-10 ENCOUNTER — Encounter (HOSPITAL_BASED_OUTPATIENT_CLINIC_OR_DEPARTMENT_OTHER)
